# Patient Record
Sex: Female | Born: 1960
Health system: Southern US, Community
[De-identification: ages and names within clinical notes are randomized; demographics above are authoritative.]

## PROBLEM LIST (undated history)

## (undated) DIAGNOSIS — I10 Essential (primary) hypertension: Secondary | ICD-10-CM

## (undated) DIAGNOSIS — E7800 Pure hypercholesterolemia, unspecified: Secondary | ICD-10-CM

## (undated) HISTORY — PX: ABDOMINAL HYSTERECTOMY: SHX81

## (undated) HISTORY — PX: ROTATOR CUFF REPAIR: SHX139

## (undated) HISTORY — DX: Essential (primary) hypertension: I10

---

## 2000-11-20 ENCOUNTER — Ambulatory Visit (HOSPITAL_COMMUNITY): Admission: RE | Admit: 2000-11-20 | Discharge: 2000-11-20 | Payer: Self-pay | Admitting: Pulmonary Disease

## 2000-11-20 ENCOUNTER — Encounter: Payer: Self-pay | Admitting: Pulmonary Disease

## 2000-12-14 ENCOUNTER — Ambulatory Visit (HOSPITAL_COMMUNITY): Admission: RE | Admit: 2000-12-14 | Discharge: 2000-12-14 | Payer: Self-pay | Admitting: Urology

## 2008-01-30 ENCOUNTER — Ambulatory Visit (HOSPITAL_COMMUNITY): Admission: RE | Admit: 2008-01-30 | Discharge: 2008-01-30 | Payer: Self-pay | Admitting: Obstetrics & Gynecology

## 2008-02-04 ENCOUNTER — Ambulatory Visit (HOSPITAL_COMMUNITY): Admission: RE | Admit: 2008-02-04 | Discharge: 2008-02-04 | Payer: Self-pay | Admitting: Obstetrics & Gynecology

## 2008-06-18 ENCOUNTER — Emergency Department (HOSPITAL_COMMUNITY): Admission: EM | Admit: 2008-06-18 | Discharge: 2008-06-18 | Payer: Self-pay | Admitting: Emergency Medicine

## 2008-06-24 ENCOUNTER — Inpatient Hospital Stay (HOSPITAL_COMMUNITY): Admission: RE | Admit: 2008-06-24 | Discharge: 2008-06-28 | Payer: Self-pay | Admitting: Obstetrics & Gynecology

## 2008-06-24 ENCOUNTER — Encounter: Payer: Self-pay | Admitting: Obstetrics & Gynecology

## 2009-03-03 ENCOUNTER — Ambulatory Visit (HOSPITAL_COMMUNITY): Admission: RE | Admit: 2009-03-03 | Discharge: 2009-03-03 | Payer: Self-pay | Admitting: Pulmonary Disease

## 2010-03-29 ENCOUNTER — Other Ambulatory Visit (HOSPITAL_COMMUNITY): Payer: Self-pay | Admitting: Pulmonary Disease

## 2010-03-29 DIAGNOSIS — Z1231 Encounter for screening mammogram for malignant neoplasm of breast: Secondary | ICD-10-CM

## 2010-04-02 ENCOUNTER — Ambulatory Visit (HOSPITAL_COMMUNITY)
Admission: RE | Admit: 2010-04-02 | Discharge: 2010-04-02 | Disposition: A | Discharge status: Home / Self Care | Payer: Federal, State, Local not specified - PPO | Source: Ambulatory Visit | Attending: Pulmonary Disease | Admitting: Pulmonary Disease

## 2010-04-02 DIAGNOSIS — Z1231 Encounter for screening mammogram for malignant neoplasm of breast: Secondary | ICD-10-CM | POA: Insufficient documentation

## 2010-05-03 LAB — BASIC METABOLIC PANEL
BUN: 1 mg/dL — ABNORMAL LOW (ref 6–23)
Calcium: 7.7 mg/dL — ABNORMAL LOW (ref 8.4–10.5)
Creatinine, Ser: 0.67 mg/dL (ref 0.4–1.2)
GFR calc Af Amer: >60 mL/min (ref >60)
GFR calc non Af Amer: >60 mL/min (ref >60)

## 2010-05-03 LAB — CBC
HCT: 31.3 % — ABNORMAL LOW (ref 36.0–46.0)
HCT: 37.3 % (ref 36.0–46.0)
Hemoglobin: 13.4 g/dL (ref 12.0–15.0)
MCHC: 35.8 g/dL (ref 30.0–36.0)
MCHC: 36 g/dL (ref 30.0–36.0)
Platelets: 240 10*3/uL (ref 150–400)
RDW: 11.8 % (ref 11.5–15.5)
WBC: 5.7 10*3/uL (ref 4.0–10.5)

## 2010-05-03 LAB — ABO/RH: ABO/RH(D): A POS

## 2010-05-04 LAB — URINALYSIS, ROUTINE W REFLEX MICROSCOPIC
Bilirubin Urine: NEGATIVE
Glucose, UA: NEGATIVE mg/dL
Ketones, ur: NEGATIVE mg/dL
Leukocytes, UA: NEGATIVE
Nitrite: NEGATIVE
Protein, ur: NEGATIVE mg/dL
Specific Gravity, Urine: 1.021 (ref 1.005–1.030)
Urobilinogen, UA: 0.2 mg/dL (ref 0.0–1.0)
pH: 6 (ref 5.0–8.0)

## 2010-05-04 LAB — POCT PREGNANCY, URINE: Preg Test, Ur: NEGATIVE

## 2010-05-04 LAB — CBC
HCT: 40 % (ref 36.0–46.0)
MCV: 94 fL (ref 78.0–100.0)
Platelets: 231 10*3/uL (ref 150–400)
RDW: 12.3 % (ref 11.5–15.5)
WBC: 7.9 10*3/uL (ref 4.0–10.5)

## 2010-05-04 LAB — URINE MICROSCOPIC-ADD ON

## 2010-05-04 LAB — DIFFERENTIAL
Eosinophils Relative: 1 % (ref 0–5)
Lymphocytes Relative: 18 % (ref 12–46)
Lymphs Abs: 1.4 10*3/uL (ref 0.7–4.0)
Monocytes Absolute: 0.8 10*3/uL (ref 0.1–1.0)
Monocytes Relative: 10 % (ref 3–12)
Neutro Abs: 5.6 10*3/uL (ref 1.7–7.7)

## 2010-05-04 LAB — COMPREHENSIVE METABOLIC PANEL
AST: 18 U/L (ref 0–37)
Albumin: 3.6 g/dL (ref 3.5–5.2)
BUN: 14 mg/dL (ref 6–23)
Creatinine, Ser: 0.91 mg/dL (ref 0.4–1.2)
GFR calc Af Amer: >60 mL/min (ref >60)
Total Protein: 7.6 g/dL (ref 6.0–8.3)

## 2010-06-08 NOTE — Op Note (Signed)
Annette Patterson, Annette Patterson                  ACCOUNT NO.:  [id number]   MEDICAL RECORD NO.:  [id number]          PATIENT TYPE:  INP   LOCATION:  9319                          FACILITY:  [facility]   PHYSICIAN:  Roseanna Rainbow, M.D.DATE OF BIRTH:  1960/05/09   DATE OF PROCEDURE:  06/24/2008  DATE OF DISCHARGE:                               OPERATIVE REPORT   PREOPERATIVE DIAGNOSIS:  Symptomatic uterine fibroids with right-sided  hydronephrosis.   POSTOPERATIVE DIAGNOSIS:  Symptomatic uterine fibroids with right-sided  hydronephrosis.   PROCEDURE:  Total abdominal hysterectomy.   SURGEON:  Roseanna Rainbow, MD   ASSISTANT:  Bing Neighbors. Clearance Coots, MD   ANESTHESIA:  General endotracheal.   COMPLICATIONS:  None.   IV FLUIDS:  3 L crystalloid.   ESTIMATED BLOOD LOSS:  500 mL.   URINE OUTPUT:  300 mL.   FINDING:  The uterus was  diffusely enlarged, irregular in contour with  fibroid involvement.  There was an intraligamentous myoma on the right  side.  The ovaries were normal appearing.   PROCEDURE:  The patient was taken to the operating room with an IV  running.  She was placed in the dorsal lithotomy position and prepped  and draped in the usual sterile fashion.  After a time-out had been  completed, a midline vertical incision was made with scalpel.  The  incision was then extended down to the fascia with the Bovie.  The  fascia was incised along the length of the incision.  The rectus muscles  were separated.  The parietal peritoneum was picked up and entered  sharply.  This incision was then extended superiorly and inferiorly.  The bulk of uterus precluded placement of the retracting system.  At  this point, the bowel was packed away with moistened laparotomy sponges.  A towel clip was used to grasp the fundal myoma and this was used for  traction.  The Deaver was then placed into the abdomen on the right.  The round ligament was identified and divided using the Bovie.  The  peritoneum of the broad ligament was then incised to the midline.  The  bladder flap was then developed using sharp dissection using the Bovie.  As the ovaries were to be conserved, a window was made through the broad  ligament.  The utero-ovarian ligaments and fallopian tube was doubly  clamped with parametrial clamps.  The pedicle was secured with both a  free ligature of 0 Vicryl and a suture ligature.  Adequate hemostasis  was noted.  This procedure was then repeated on the left side.  To  facilitate securing the uterine vessel on the right, the  intraligamentous myoma that was noted above was enucleated using both  sharp and blunt dissection.  At this point, the uterine artery was  doubly clamped, transected, and secured with an 0 vicryl suture.  The  uterine artery on the left again was doubly clamped with parametrial  clamps, transected and suture ligated.  At this point, the uterine  fundus was amputated above the level of the uterine vessel pedicles.  Packing was  then removed from the abdomen.  A Balfour retractor was then  placed into the incision.  The bowel again was repacked with moistened  laparotomy packs.  Kocher clamps had been placed on the cervical stump.  The cardinal ligaments were then transected and suture ligated  bilaterally.  The cervix was then amputated.  The remainder of the  vaginal cuff was repaired with figure-of-eight 0 Vicryl sutures.  Adequate hemostasis was noted.  The  adnexal pedicles were then  inspected for hemostasis.  The peritoneal edge inferior to the tied  pedicle on the right was noted to have small active bleeding.  The  peritoneum was cauterized and small hemoclips were placed.  Adequate  hemostasis was noted.  The pelvis was then copiously irrigated.  The  retractor and sponge packs were then removed from the abdomen.  The  parietal peritoneum and fascial incisions were closed in a single layer  using 2 running sutures of 0 PDS.  The skin  was closed with staples.  At  the close of the procedure, the instrument and pack counts were said to  be correct x2.  The patient was taken to the PACU awake and in stable  condition.      Roseanna Rainbow, M.D.  Electronically Signed     LAJ/MEDQ  D:  06/24/2008  T:  06/25/2008  Job:  [id number]

## 2010-06-08 NOTE — Discharge Summary (Signed)
Annette Patterson, Annette Patterson                  ACCOUNT NO.:  [id number]   MEDICAL RECORD NO.:  [id number]          PATIENT TYPE:  INP   LOCATION:  9319                          [facility]  [facility]   PHYSICIAN:  Roseanna Rainbow, M.D.DATE OF BIRTH:  1960/08/11   DATE OF ADMISSION:  06/24/2008  DATE OF DISCHARGE:  06/28/2008                               DISCHARGE SUMMARY   CHIEF COMPLAINT:  The patient is a 50 year old with symptomatic uterine  fibroids, who presents for a total abdominal hysterectomy.  Please see  the dictated history and physical.   HOSPITAL COURSE:  The patient was admitted and underwent a total  abdominal hysterectomy.  Please see the dictated operative summary.  On  postoperative day #1, hemoglobin was 11.3 and basic metabolic profile  was normal.  Her bowel function gradually returned and she was  discharged to home.  On postoperative day #4, tolerating a regular diet.   DISCHARGE DIAGNOSIS:  Symptomatic uterine fibroids.   PROCEDURE:  Total abdominal hysterectomy.   CONDITION:  Good.   DIET:  Regular.   ACTIVITY:  Pelvic rest, progressive activity.   MEDICATIONS:  Percocet 1-2 tablets every 6 hours as needed.   DISPOSITION:  The patient was to follow up in the office in 1 week for  staple removal.      Roseanna Rainbow, M.D.  Electronically Signed     LAJ/MEDQ  D:  06/28/2008  T:  06/29/2008  Job:  [id number]

## 2010-06-08 NOTE — H&P (Signed)
NAMECHANTALE, Annette Patterson                  ACCOUNT NO.:  [id number]   MEDICAL RECORD NO.:  [id number]          PATIENT TYPE:  AMB   LOCATION:  SDC                           [facility]  [facility]   PHYSICIAN:  Roseanna Rainbow, M.D.DATE OF BIRTH:  Oct 17, 1960   DATE OF ADMISSION:  DATE OF DISCHARGE:                              HISTORY & PHYSICAL   CHIEF COMPLAINT:  The patient is a 50 year old with symptomatic uterine  fibroids who presents for a total abdominal hysterectomy.   HISTORY OF PRESENT ILLNESS:  The patient has a history of irregular  menses.  She has a history of a moderately enlarged myomatous uterus.  She recently presented to the emergency department with right-sided  flank pain.  A workup at this point included a CT scan of the abdomen  and pelvis that showed mild right-sided hydronephrosis likely secondary  to external compression by the myomatous uterus at the pelvic rim.  A  serum creatinine was normal.  Other workup had included a previous  pelvic ultrasound performed in January 2010 that showed a uterus  approximately 16 cm in sagittal diameter and a transverse diameter of  12.4 cm with diffuse fibroid involvement.  The fibroids measured up to  6.8 cm in diameter.  This was a central fibroid with a submucosal  component.  A hemoglobin in December 2009 was 13.4.  FSH, prolactin, TSH  were all normal.  A Pap smear in December 2009 was negative.  The  patient also underwent an endometrial biopsy in January 2010.  The  pathology failed to demonstrate any hyperplasia or malignancy.   ALLERGIES:  No known drug allergies.   MEDICATIONS:  Please see the medication reconciliation form.   PAST MEDICAL HISTORY:  No significant history of medical diseases.   PAST SURGICAL HISTORY:  She had a shoulder surgery and a vague history  of a possible vaginal procedure.   SOCIAL HISTORY:  She is married, living with her spouse.  She used to be  in [employer].  She has no significant  smoking history.  She drinks  a moderate amount of alcohol, drug beverages, and denies illicit drug  use.   FAMILY HISTORY:  Remarkable for asthma.   PAST GYN HISTORY:  Please see the above.   PAST OBSTETRICAL HISTORY:  The patient has never been pregnant.   REVIEW OF SYSTEMS:  GU:  Please see the above.   PHYSICAL EXAMINATION:  VITAL SIGNS:  Temperature 97.9, blood pressure  127/85, and weight 159 pounds.  GENERAL:  A well developed, well nourished [place] female, in  no apparent distress.  LUNGS:  Clear to auscultation bilaterally.  HEART:  Regular rate and rhythm.  NECK:  Supple.  ABDOMEN:  There is a mass arising from the pelvis which is approximately  4 fingerbreadths below the umbilicus.  There is minimal mobility.  The  mass is nontender.  PELVIC:  There are bilateral white patches on the vulva.  The vagina is  clean.  Cervix is without lesions.  The uterus is approximately 16 to 18  weeks aggregate size, irregular  contour.  The adnexa are nonpalpable.  BREASTS:  No masses or discharge, tenderness, or overriding skin  changes.   ASSESSMENT:  Symptomatic uterine fibroids.   PLAN:  The planned procedure is a total abdominal hysterectomy.  The  risks, benefits, and alternatives forms of management were reviewed with  the patient and informed consent has been obtained.  The patient would  like to conserve her ovaries if they are normal in appearance at the  time of surgery.      Roseanna Rainbow, M.D.  Electronically Signed     LAJ/MEDQ  D:  06/20/2008  T:  06/21/2008  Job:  [id number]

## 2010-06-11 NOTE — Op Note (Signed)
[facility]  Patient:    Annette Patterson, Annette Patterson Visit Number: [id number] MRN: [id number]          Service Type: DSU Location: DAY Attending Physician:  Lindaann Slough Dictated by:   Lindaann Slough, M.D. Proc. Date: 12/14/00 Admit Date:  12/14/2000   CC:         Eino Farber., M.D.   Operative Report  PREOPERATIVE DIAGNOSIS:  Microhematuria, rule out left ureteral tumor.  POSTOPERATIVE DIAGNOSIS:  Microhematuria, no ureteral tumor.  PROCEDURE PERFORMED:  Cystoscopy, left retrograde pyelogram, ureteroscopy, urethral balloon dilation of left intramural ureter, and insertion of double-J catheter.  SURGEON:  Lindaann Slough, M.D.  ANESTHESIA:  General.  INDICATIONS:  The patient is a 50 year old female who was found on urinalysis to have microhematuria.  An IVP showed normal kidneys and extrinsic compression of the left distal ureter without intramural lesion.  She also has proximal dilation of the distal ureter.  She is scheduled for cystoscopy, retrograde pyelogram, and ureteroscopy.  FINDINGS:  No bladder stones or tumor, and no ureteral stone or tumor.  DESCRIPTION OF PROCEDURE:  Under general anesthesia, the patient was prepped and draped, and placed in the dorsolithotomy position.  A #22 Wappler cystoscope was inserted in the bladder.  The bladder mucosa is normal.  There is no stone or tumor in the bladder.  The ureteral orifices are in normal position and shape with clear efflux.  A cone-tip catheter was then passed through the cystoscope into the left ureteral orifice.  Contrast was then injected through the cone-tip catheter.  The ureter appeared normal.  There is a mild extrinsic pressure on the left distal ureter without evidence of mucosal filling defect.  The cone-tip catheter was then removed.  A guidewire was then passed through the cystoscope into the left ureter.  The cystoscope was removed.  A 6.5-French rigid  ureteroscope could not be passed through the intramural ureter.  The ureter was _________ removed.  The intramural ureter was then dilated with the ureteral balloon catheter.  The ureteral balloon catheter was then removed.  The ureteroscope was then passed in the bladder and into the left ureter.  The ureter appears normal.  There is no stone or tumor in the ureter.  There is a coursing vessel at the level of the distal ureter that was pulsating, and that could explain the extrinsic defect of the distal ureter.  The ureteroscope was then advanced up to the upper ureter without difficulty, and there was no evidence of filling defect in the ureter. The ureteroscope was then removed.  The guidewire was then backloaded into the cystoscope and a #6-French - 26 double-J catheter was passed over the guidewire.  The proximal end of the double-J catheter is in the collecting system.  The distal end is in the bladder.  The bladder was then emptied and the cystoscope and guidewire were removed.  The patient tolerated the procedure well and left the OR in satisfactory condition to the postanesthesia care unit. Dictated by:   Lindaann Slough, M.D. Attending Physician:  Lindaann Slough DD:  12/14/00 TD:  12/15/00 Job: [id number]

## 2010-08-18 ENCOUNTER — Inpatient Hospital Stay (INDEPENDENT_AMBULATORY_CARE_PROVIDER_SITE_OTHER)
Admission: RE | Admit: 2010-08-18 | Discharge: 2010-08-18 | Disposition: A | Discharge status: Home / Self Care | Payer: Federal, State, Local not specified - PPO | Source: Ambulatory Visit | Attending: Emergency Medicine | Admitting: Emergency Medicine

## 2010-08-18 DIAGNOSIS — G609 Hereditary and idiopathic neuropathy, unspecified: Secondary | ICD-10-CM

## 2011-04-25 ENCOUNTER — Other Ambulatory Visit (HOSPITAL_COMMUNITY): Payer: Self-pay | Admitting: Family Medicine

## 2011-04-25 DIAGNOSIS — Z1231 Encounter for screening mammogram for malignant neoplasm of breast: Secondary | ICD-10-CM

## 2011-05-19 ENCOUNTER — Ambulatory Visit (HOSPITAL_COMMUNITY)
Admission: RE | Admit: 2011-05-19 | Discharge: 2011-05-19 | Disposition: A | Discharge status: Home / Self Care | Payer: Federal, State, Local not specified - PPO | Source: Ambulatory Visit | Attending: Family Medicine | Admitting: Family Medicine

## 2011-05-19 DIAGNOSIS — Z1231 Encounter for screening mammogram for malignant neoplasm of breast: Secondary | ICD-10-CM

## 2012-05-30 ENCOUNTER — Other Ambulatory Visit (HOSPITAL_COMMUNITY): Payer: Self-pay | Admitting: Pulmonary Disease

## 2012-05-30 DIAGNOSIS — Z1231 Encounter for screening mammogram for malignant neoplasm of breast: Secondary | ICD-10-CM

## 2012-06-08 ENCOUNTER — Ambulatory Visit (HOSPITAL_COMMUNITY)
Admission: RE | Admit: 2012-06-08 | Discharge: 2012-06-08 | Disposition: A | Discharge status: Home / Self Care | Payer: Federal, State, Local not specified - PPO | Source: Ambulatory Visit | Attending: Pulmonary Disease | Admitting: Pulmonary Disease

## 2012-06-08 DIAGNOSIS — Z1231 Encounter for screening mammogram for malignant neoplasm of breast: Secondary | ICD-10-CM

## 2012-12-17 ENCOUNTER — Other Ambulatory Visit (HOSPITAL_COMMUNITY): Payer: Self-pay | Admitting: Pulmonary Disease

## 2012-12-17 DIAGNOSIS — K219 Gastro-esophageal reflux disease without esophagitis: Secondary | ICD-10-CM

## 2012-12-24 ENCOUNTER — Other Ambulatory Visit (HOSPITAL_COMMUNITY): Payer: Federal, State, Local not specified - PPO

## 2012-12-24 ENCOUNTER — Ambulatory Visit (HOSPITAL_COMMUNITY): Payer: Federal, State, Local not specified - PPO

## 2013-01-30 ENCOUNTER — Encounter (HOSPITAL_COMMUNITY): Payer: Self-pay | Admitting: Emergency Medicine

## 2013-01-30 ENCOUNTER — Emergency Department (HOSPITAL_COMMUNITY)
Admission: EM | Admit: 2013-01-30 | Discharge: 2013-01-31 | Disposition: A | Discharge status: Home / Self Care | Payer: Federal, State, Local not specified - PPO | Attending: Emergency Medicine | Admitting: Emergency Medicine

## 2013-01-30 ENCOUNTER — Emergency Department (HOSPITAL_COMMUNITY): Payer: Federal, State, Local not specified - PPO

## 2013-01-30 DIAGNOSIS — Z9889 Other specified postprocedural states: Secondary | ICD-10-CM | POA: Insufficient documentation

## 2013-01-30 DIAGNOSIS — M25511 Pain in right shoulder: Secondary | ICD-10-CM

## 2013-01-30 DIAGNOSIS — Z862 Personal history of diseases of the blood and blood-forming organs and certain disorders involving the immune mechanism: Secondary | ICD-10-CM | POA: Insufficient documentation

## 2013-01-30 DIAGNOSIS — Z8639 Personal history of other endocrine, nutritional and metabolic disease: Secondary | ICD-10-CM | POA: Insufficient documentation

## 2013-01-30 DIAGNOSIS — M25519 Pain in unspecified shoulder: Secondary | ICD-10-CM | POA: Insufficient documentation

## 2013-01-30 HISTORY — DX: Pure hypercholesterolemia, unspecified: E78.00

## 2013-01-30 LAB — POCT I-STAT TROPONIN I: TROPONIN I, POC: 0.01 ng/mL (ref 0.00–0.08)

## 2013-01-30 MED ORDER — CYCLOBENZAPRINE HCL 10 MG PO TABS: 10.0000 mg | ORAL_TABLET | Freq: Three times a day (TID) | ORAL | Status: DC | PRN

## 2013-01-30 MED ORDER — HYDROCODONE-ACETAMINOPHEN 10-325 MG PO TABS: 0.5000 | ORAL_TABLET | Freq: Four times a day (QID) | ORAL | Status: DC | PRN

## 2013-01-30 NOTE — ED Notes (Signed)
Pt c/o right shoulder pain radiating to mid sternal area; worsening pain since this am

## 2013-01-30 NOTE — ED Notes (Signed)
Pt states pain increases when she reaches with her right arm

## 2013-01-30 NOTE — Discharge Instructions (Signed)
Do not drive, operate heavy machinery, drink alcohol, or take other tylenol containing products with these medicines Musculoskeletal Pain Musculoskeletal pain is muscle and boney aches and pains. These pains can occur in any part of the body. Your caregiver may treat you without knowing the cause of the pain. They may treat you if blood or urine tests, X-rays, and other tests were normal.  CAUSES There is often not a definite cause or reason for these pains. These pains may be caused by a type of germ (virus). The discomfort may also come from overuse. Overuse includes working out too hard when your body is not fit. Boney aches also come from weather changes. Bone is sensitive to atmospheric pressure changes. HOME CARE INSTRUCTIONS   Ask when your test results will be ready. Make sure you get your test results.  Only take over-the-counter or prescription medicines for pain, discomfort, or fever as directed by your caregiver. If you were given medications for your condition, do not drive, operate machinery or power tools, or sign legal documents for 24 hours. Do not drink alcohol. Do not take sleeping pills or other medications that may interfere with treatment.  Continue all activities unless the activities cause more pain. When the pain lessens, slowly resume normal activities. Gradually increase the intensity and duration of the activities or exercise.  During periods of severe pain, bed rest may be helpful. Lay or sit in any position that is comfortable.  Putting ice on the injured area.  Put ice in a bag.  Place a towel between your skin and the bag.  Leave the ice on for 15 to 20 minutes, 3 to 4 times a day.  Follow up with your caregiver for continued problems and no reason can be found for the pain. If the pain becomes worse or does not go away, it may be necessary to repeat tests or do additional testing. Your caregiver may need to look further for a possible cause. SEEK IMMEDIATE  MEDICAL CARE IF:  You have pain that is getting worse and is not relieved by medications.  You develop chest pain that is associated with shortness or breath, sweating, feeling sick to your stomach (nauseous), or throw up (vomit).  Your pain becomes localized to the abdomen.  You develop any new symptoms that seem different or that concern you. MAKE SURE YOU:   Understand these instructions.  Will watch your condition.  Will get help right away if you are not doing well or get worse. Document Released: 01/10/2005 Document Revised: 04/04/2011 Document Reviewed: 08/31/2007 [facility] Patient Information 2014 [place].  Heat Therapy Heat therapy can help ease achy, tense, stiff, and tight muscles and joints. Heat should not be used on new injuries. Wait at least 48 hours after the injury before using heat therapy. Heat also should not be used for discomfort or pain that occurs right after doing an activity. If you still have pain or stiffness 3 hours after finishing the activity, then heat therapy may be used. PRECAUTIONS  High heat or prolonged exposure to heat can cause burns. Be careful when using heat therapy to avoid burning your skin. If you have any of the following conditions, do not use heat until you have discussed heat therapy with your caregiver:  Poor circulation.  Healing wounds or scarred skin in the area being treated.  Diabetes, heart disease, or high blood pressure.  Numbness of the area being treated.  Unusual swelling of the area being treated.  Active  infections.  Blood clots.  Cancer.  Inability to communicate your response to pain. This can include young children and people with dementia. HOME CARE INSTRUCTIONS Moist heat pack  Soak a clean towel in warm water, and squeeze out the extra water. The water temperature should be comfortable to the skin.  Put the warm, wet towel in a plastic bag.  Place a thin, dry towel between your skin and the  bag.  Put the heat pack on the area for 5 minutes, and check your skin. Your skin may be pink, but it should not be red.  Leave the heat pack on the area for a total of 15 to 30 minutes.  Repeat this every 2 to 4 hours while awake. Do not use heat while you are sleeping. Warm water bath  Fill a tub with warm water. The water temperature should be comfortable to the skin.  Place the affected body part in the tub.  Soak the area for 20 to 40 minutes.  Repeat as needed. Hot water bottle  Fill the water bottle half full with hot water.  Press out the extra air. Close the cap tightly.  Place a dry towel between your skin and the bottle.  Put the bottle on the area for 5 minutes, and check your skin. Your skin may be pink, but it should not be red.  Leave the bottle on the area for a total of 15 to 30 minutes.  Repeat this every 2 to 4 hours while awake. Electric heating pad  Place a dry towel between your skin and the heating pad.  Set the heating pad on low heat.  Put the heating pad on the area for 10 minutes, and check your skin. Your skin may be pink, but it should not be red.  Leave the heating pad on the area for a total of 20 to 40 minutes.  Repeat this every 2 to 4 hours while awake.  Do not lie on the heating pad.  Do not fall asleep while using the heating pad.  Do not use the heating pad near water. Contact with water can result in an electrical shock. SEEK MEDICAL CARE IF:  You have blisters, redness, swelling, or numbness.  You have any new problems.  Your problems are getting worse.  You have any questions or concerns. If you develop any problems, stop using heat therapy until you see your caregiver. MAKE SURE YOU:  Understand these instructions.  Will watch your condition.  Will get help right away if you are not doing well or get worse. Document Released: 04/04/2011 Document Reviewed: 04/04/2011 [facility] Patient Information 2014 [place]. Marland Kitchen

## 2013-01-30 NOTE — ED Provider Notes (Signed)
CSN: [id number]     Arrival date & time 01/30/13  2202 History   First MD Initiated Contact with Patient 01/30/13 2223     Chief Complaint  Patient presents with  . Shoulder Pain   (Consider location/radiation/quality/duration/timing/severity/associated sxs/prior Treatment) HPI   This is a 53 year old female presents to the emergency department complaining of right neck and shoulder pain.  She has a history of right rotator cuff repair.  Patient is an employee at the [employer].  She states that she woke with pain in her left upper shoulder today.  Throughout the day as she was moving boxes and holding them of her head she had increasing pain.  Pain is worse when she turns her neck.  She denies any chest pain, shortness of breath, nausea, diaphoresis, left jaw or left shoulder pain.  Patient denies any respiratory symptoms.. patient tried BC powder without relief of her symptoms. Denies fevers, chills, myalgias, arthralgias. Denies DOE, SOB, chest tightness or pressure, radiation to left arm, jaw or back, or diaphoresis. Denies dysuria, flank pain, suprapubic pain, frequency, urgency, or hematuria. Denies headaches, light headedness, weakness, visual disturbances. Denies abdominal pain, nausea, vomiting, diarrhea or constipation.   Past Medical History  Diagnosis Date  . Hypercholesteremia    Past Surgical History  Procedure Laterality Date  . Rotator cuff repair    . Abdominal hysterectomy     No family history on file. History  Substance Use Topics  . Smoking status: Never Smoker   . Smokeless tobacco: Not on file  . Alcohol Use: 1.2 oz/week    2 Cans of beer per week   OB History   Grav Para Term Preterm Abortions TAB SAB Ect Mult Living                 Review of Systems Ten systems reviewed and are negative for acute change, except as noted in the HPI.   Allergies  Shellfish allergy  Home Medications  No current outpatient prescriptions on file. BP 131/79  Pulse 70   Temp(Src) 98.3 F (36.8 C)  Resp 16  SpO2 99% Physical Exam Physical Exam  Nursing note and vitals reviewed. Constitutional: She is oriented to person, place, and time. She appears well-developed and well-nourished. No distress.  HENT:  Head: Normocephalic and atraumatic.  Eyes: Conjunctivae normal and EOM are normal. Pupils are equal, round, and reactive to light. No scleral icterus.  Neck: Normal range of motion.  Cardiovascular: Normal rate, regular rhythm and normal heart sounds.  Exam reveals no gallop and no friction rub.   No murmur heard. Pulmonary/Chest: Effort normal and breath sounds normal. No respiratory distress.  Abdominal: Soft. Bowel sounds are normal. She exhibits no distension and no mass. There is no tenderness. There is no guarding.  Neurological: She is alert and oriented to person, place, and time.  Skin: Skin is warm and dry. She is not diaphoretic.  Musculoskeletal: Right shoulder with full range of motion.  Patient has tenderness to palpation at the insertion of the levator scapula on the right side.  Patient has pain with left neck rotation and and left lateral flexion of the neck.  ED Course  Procedures (including critical care time) Labs Review Labs Reviewed  POCT I-STAT TROPONIN I   Imaging Review Dg Shoulder Right  01/30/2013   CLINICAL DATA:  Pain  EXAM: RIGHT SHOULDER - 2+ VIEW  COMPARISON:  None.  FINDINGS: Frontal, Y scapular, and axillary images were obtained. There is moderate generalized osteoarthritic  change. No fracture or dislocation. No erosive change or intra-articular calcification.  IMPRESSION: Moderate osteoarthritis.  No fracture or dislocation.   Electronically Signed   By: Lowella Grip M.D.   On: 01/30/2013 22:56    EKG Interpretation    Date/Time:  Wednesday January 30 2013 22:19:27 EST Ventricular Rate:  84 PR Interval:  139 QRS Duration: 83 QT Interval:  366 QTC Calculation: 433 R Axis:   48 Text Interpretation:  Sinus  rhythm No significant change since last tracing Confirmed by KNAPP  MD-J, JON ([id number]) on 01/30/2013 10:43:49 PM            MDM   1. Trigger point of shoulder region, right   patient with musculoskeletal shoulder pain. Will d/c home with pain medication, muscle relaxer. F./u with pcp.     Margarita Mail, PA-C 01/31/13 [phone]

## 2013-01-31 NOTE — ED Provider Notes (Signed)
Medical screening examination/treatment/procedure(s) were performed by non-physician practitioner and as supervising physician I was immediately available for consultation/collaboration.  EKG Interpretation    Date/Time:  Wednesday January 30 2013 22:19:27 EST Ventricular Rate:  84 PR Interval:  139 QRS Duration: 83 QT Interval:  366 QTC Calculation: 433 R Axis:   48 Text Interpretation:  Sinus rhythm No significant change since last tracing Confirmed by KNAPP  MD-J, JON ([id number]) on 01/30/2013 10:43:49 PM             Veryl Speak, MD 01/31/13 [id number]

## 2013-04-17 ENCOUNTER — Ambulatory Visit (HOSPITAL_COMMUNITY)
Admission: RE | Admit: 2013-04-17 | Discharge: 2013-04-17 | Disposition: A | Discharge status: Home / Self Care | Payer: BC Managed Care – PPO | Source: Ambulatory Visit | Attending: Pulmonary Disease | Admitting: Pulmonary Disease

## 2013-04-17 ENCOUNTER — Other Ambulatory Visit (HOSPITAL_COMMUNITY): Payer: Self-pay | Admitting: Pulmonary Disease

## 2013-04-17 DIAGNOSIS — R52 Pain, unspecified: Secondary | ICD-10-CM

## 2013-04-17 DIAGNOSIS — M25559 Pain in unspecified hip: Secondary | ICD-10-CM | POA: Insufficient documentation

## 2013-04-17 DIAGNOSIS — M1990 Unspecified osteoarthritis, unspecified site: Secondary | ICD-10-CM

## 2013-04-17 DIAGNOSIS — R209 Unspecified disturbances of skin sensation: Secondary | ICD-10-CM | POA: Insufficient documentation

## 2013-06-26 ENCOUNTER — Other Ambulatory Visit (HOSPITAL_COMMUNITY): Payer: Self-pay | Admitting: Pulmonary Disease

## 2013-06-26 DIAGNOSIS — Z1231 Encounter for screening mammogram for malignant neoplasm of breast: Secondary | ICD-10-CM

## 2013-06-28 ENCOUNTER — Ambulatory Visit (HOSPITAL_COMMUNITY)
Admission: RE | Admit: 2013-06-28 | Discharge: 2013-06-28 | Disposition: A | Discharge status: Home / Self Care | Payer: Federal, State, Local not specified - PPO | Source: Ambulatory Visit | Attending: Pulmonary Disease | Admitting: Pulmonary Disease

## 2013-06-28 DIAGNOSIS — Z1231 Encounter for screening mammogram for malignant neoplasm of breast: Secondary | ICD-10-CM | POA: Insufficient documentation

## 2013-09-10 ENCOUNTER — Other Ambulatory Visit (HOSPITAL_COMMUNITY): Payer: Self-pay | Admitting: Pulmonary Disease

## 2013-09-10 DIAGNOSIS — R1310 Dysphagia, unspecified: Secondary | ICD-10-CM

## 2013-09-16 ENCOUNTER — Ambulatory Visit (HOSPITAL_COMMUNITY)
Admission: RE | Admit: 2013-09-16 | Discharge: 2013-09-16 | Disposition: A | Discharge status: Home / Self Care | Payer: BC Managed Care – PPO | Source: Ambulatory Visit | Attending: Pulmonary Disease | Admitting: Pulmonary Disease

## 2013-09-16 ENCOUNTER — Ambulatory Visit (HOSPITAL_COMMUNITY)
Admission: RE | Admit: 2013-09-16 | Discharge: 2013-09-16 | Disposition: A | Discharge status: Home / Self Care | Payer: Federal, State, Local not specified - PPO | Source: Ambulatory Visit | Attending: Pulmonary Disease | Admitting: Pulmonary Disease

## 2013-09-16 DIAGNOSIS — R1310 Dysphagia, unspecified: Secondary | ICD-10-CM

## 2013-09-16 DIAGNOSIS — K224 Dyskinesia of esophagus: Secondary | ICD-10-CM | POA: Insufficient documentation

## 2013-10-09 ENCOUNTER — Ambulatory Visit (INDEPENDENT_AMBULATORY_CARE_PROVIDER_SITE_OTHER): Payer: BC Managed Care – PPO | Admitting: Obstetrics & Gynecology

## 2013-10-09 ENCOUNTER — Encounter: Payer: Self-pay | Admitting: Obstetrics & Gynecology

## 2013-10-09 VITALS — BP 140/83 | HR 75 | Temp 98.0°F | Ht 63.0 in | Wt 167.0 lb

## 2013-10-09 DIAGNOSIS — Z01419 Encounter for gynecological examination (general) (routine) without abnormal findings: Secondary | ICD-10-CM

## 2013-10-09 DIAGNOSIS — N951 Menopausal and female climacteric states: Secondary | ICD-10-CM

## 2013-10-09 MED ORDER — ESTRADIOL 0.05 MG/24HR TD PTWK: 0.0500 mg | MEDICATED_PATCH | TRANSDERMAL | Status: DC

## 2013-10-09 NOTE — Progress Notes (Signed)
Subjective:     Annette Patterson is a 53 y.o. female here for a routine exam.  Current complaints: hot flushes .    Personal health questionnaire:  Is patient [name], have a family history of breast and/or ovarian cancer: no Is there a family history of uterine cancer diagnosed at age < 67, gastrointestinal cancer, urinary tract cancer, family member who is a Field seismologist syndrome-associated carrier: no Is the patient overweight and hypertensive, family history of diabetes, personal history of gestational diabetes or PCOS: no Is patient over 38, have PCOS,  family history of premature CHD under age 50, diabetes, smoke, have hypertension or peripheral artery disease:  no At any time, has a partner hit, kicked or otherwise hurt or frightened you?: no Over the past 2 weeks, have you felt down, depressed or hopeless?: no Over the past 2 weeks, have you felt little interest or pleasure in doing things?:no   Gynecologic History No LMP recorded. Patient has had a hysterectomy.      Last mammogram: 6/15. Results were: normal  Obstetric History OB History  Gravida Para Term Preterm AB SAB TAB Ectopic Multiple Living  0 0 0 0 0 0 0 0 0 0         Past Medical History  Diagnosis Date  . Hypercholesteremia     Past Surgical History  Procedure Laterality Date  . Rotator cuff repair    . Abdominal hysterectomy      Current outpatient prescriptions:atorvastatin (LIPITOR) 20 MG tablet, , Disp: , Rfl: ;  estradiol (CLIMARA - DOSED IN MG/24 HR) 0.05 mg/24hr patch, Place 1 patch (0.05 mg total) onto the skin once a week., Disp: 4 patch, Rfl: 12 Allergies  Allergen Reactions  . Shellfish Allergy Hives    History  Substance Use Topics  . Smoking status: Former Research scientist (life sciences)  . Smokeless tobacco: Former Systems developer    Quit date: 01/25/1988  . Alcohol Use: 1.2 oz/week    2 Cans of beer per week    Family History  Problem Relation Age of Onset  . Hypertension Mother   . Asthma Mother   . Hypertension  Father       Review of Systems  Constitutional: negative for fatigue and weight loss Respiratory: negative for cough and wheezing Cardiovascular: negative for chest pain, fatigue and palpitations Gastrointestinal: negative for abdominal pain and change in bowel habits Musculoskeletal:negative for myalgias Neurological: negative for gait problems and tremors Behavioral/Psych: negative for abusive relationship, depression Endocrine: negative for temperature intolerance   Genitourinary:negative for genital lesions; positive for  hot flashes Integument/breast: negative for breast lump, breast tenderness, nipple discharge and skin lesion(s)    Objective:       BP 140/83  Pulse 75  Temp(Src) 98 F (36.7 C)  Ht 5\' 3"  (1.6 m)  Wt 75.751 kg (167 lb)  BMI 29.59 kg/m2 General:   alert  Skin:   no rash or abnormalities  Lungs:   clear to auscultation bilaterally  Heart:   regular rate and rhythm, S1, S2 normal, no murmur, click, rub or gallop  Breasts:   normal without suspicious masses, skin or nipple changes or axillary nodes  Abdomen:  normal findings: no organomegaly, soft, non-tender and no hernia  Pelvis:  External genitalia: normal general appearance Urinary system: urethral meatus normal and bladder without fullness, nontender Vaginal: normal without tenderness, induration or masses Adnexa: normal bimanual exam    Lab Review  Labs reviewed no Radiologic studies reviewed no    Assessment:  Healthy female exam.  Menopausal symptoms   Plan:    Education reviewed: calcium supplements, low fat, low cholesterol diet, weight bearing exercise and lifestyle modifications for menopausal symptoms. Hormone replacement therapy: risks and benefits reviewed and follow up appointment recommended.   Meds ordered this encounter  Medications  . atorvastatin (LIPITOR) 20 MG tablet    Sig:   . estradiol (CLIMARA - DOSED IN MG/24 HR) 0.05 mg/24hr patch    Sig: Place 1 patch  (0.05 mg total) onto the skin once a week.    Dispense:  4 patch    Refill:  12

## 2013-10-10 ENCOUNTER — Encounter: Payer: Self-pay | Admitting: Obstetrics & Gynecology

## 2013-10-10 NOTE — Patient Instructions (Signed)
Hormone Therapy At menopause, your body begins making less estrogen and progesterone hormones. This causes the body to stop having menstrual periods. This is because estrogen and progesterone hormones control your periods and menstrual cycle. A lack of estrogen may cause symptoms such as:  Hot flushes (or hot flashes).  Vaginal dryness.  Dry skin.  Loss of sex drive.  Risk of bone loss (osteoporosis). When this happens, you may choose to take hormone therapy to get back the estrogen lost during menopause. When the hormone estrogen is given alone, it is usually referred to as ET (Estrogen Therapy). When the hormone progestin is combined with estrogen, it is generally called HT (Hormone Therapy). This was formerly known as hormone replacement therapy (HRT). Your caregiver can help you make a decision on what will be best for you. The decision to use HT seems to change often as new studies are done. Many studies do not agree on the benefits of hormone replacement therapy. LIKELY BENEFITS OF HT INCLUDE PROTECTION FROM:  Hot Flushes (also called hot flashes) - A hot flush is a sudden feeling of heat that spreads over the face and body. The skin may redden like a blush. It is connected with sweats and sleep disturbance. Women going through menopause may have hot flushes a few times a month or several times per day depending on the woman.  Osteoporosis (bone loss)- Estrogen helps guard against bone loss. After menopause, a woman's bones slowly lose calcium and become weak and brittle. As a result, bones are more likely to break. The hip, wrist, and spine are affected most often. Hormone therapy can help slow bone loss after menopause. Weight bearing exercise and taking calcium with vitamin D also can help prevent bone loss. There are also medications that your caregiver can prescribe that can help prevent osteoporosis.  Vaginal Dryness - Loss of estrogen causes changes in the vagina. Its lining may  become thin and dry. These changes can cause pain and bleeding during sexual intercourse. Dryness can also lead to infections. This can cause burning and itching. (Vaginal estrogen treatment can help relieve pain, itching, and dryness.)  Urinary Tract Infections are more common after menopause because of lack of estrogen. Some women also develop urinary incontinence because of low estrogen levels in the vagina and bladder.  Possible other benefits of estrogen include a positive effect on mood and short-term memory in women. RISKS AND COMPLICATIONS  Using estrogen alone without progesterone causes the lining of the uterus to grow. This increases the risk of lining of the uterus (endometrial) cancer. Your caregiver should give another hormone called progestin if you have a uterus.  Women who take combined (estrogen and progestin) HT appear to have an increased risk of breast cancer. The risk appears to be small, but increases throughout the time that HT is taken.  Combined therapy also makes the breast tissue slightly denser which makes it harder to read mammograms (breast X-rays).  Combined, estrogen and progesterone therapy can be taken together every day, in which case there may be spotting of blood. HT therapy can be taken cyclically in which case you will have menstrual periods. Cyclically means HT is taken for a set amount of days, then not taken, then this process is repeated.  HT may increase the risk of stroke, heart attack, breast cancer and forming blood clots in your leg.  Transdermal estrogen (estrogen that is absorbed through the skin with a patch or a cream) may have more positive results with:    Cholesterol.  Blood pressure.  Blood clots. Having the following conditions may indicate you should not have HT:  Endometrial cancer.  Liver disease.  Breast cancer.  Heart disease.  History of blood clots.  Stroke. TREATMENT   If you choose to take HT and have a uterus,  usually estrogen and progestin are prescribed.  Your caregiver will help you decide the best way to take the medications.  Possible ways to take estrogen include:  Pills.  Patches.  Gels.  Sprays.  Vaginal estrogen cream, rings and tablets.  It is best to take the lowest dose possible that will help your symptoms and take them for the shortest period of time that you can.  Hormone therapy can help relieve some of the problems (symptoms) that affect women at menopause. Before making a decision about HT, talk to your caregiver about what is best for you. Be well informed and comfortable with your decisions. HOME CARE INSTRUCTIONS   Follow your caregivers advice when taking the medications.  A Pap test is done to screen for cervical cancer.  The first Pap test should be done at age 21.  Between ages 21 and 29, Pap tests are repeated every 2 years.  Beginning at age 30, you are advised to have a Pap test every 3 years as long as your past 3 Pap tests have been normal.  Some women have medical problems that increase the chance of getting cervical cancer. Talk to your caregiver about these problems. It is especially important to talk to your caregiver if a new problem develops soon after your last Pap test. In these cases, your caregiver may recommend more frequent screening and Pap tests.  The above recommendations are the same for women who have or have not gotten the vaccine for HPV (Human Papillomavirus).  If you had a hysterectomy for a problem that was not a cancer or a condition that could lead to cancer, then you no longer need Pap tests. However, even if you no longer need a Pap test, a regular exam is a good idea to make sure no other problems are starting.   If you are between ages 65 and 70, and you have had normal Pap tests going back 10 years, you no longer need Pap tests. However, even if you no longer need a Pap test, a regular exam is a good idea to make sure no  other problems are starting.   If you have had past treatment for cervical cancer or a condition that could lead to cancer, you need Pap tests and screening for cancer for at least 20 years after your treatment.  If Pap tests have been discontinued, risk factors (such as a new sexual partner) need to be re-assessed to determine if screening should be resumed.  Some women may need screenings more often if they are at high risk for cervical cancer.  Get mammograms done as per the advice of your caregiver. SEEK IMMEDIATE MEDICAL CARE IF:  You develop abnormal vaginal bleeding.  You have pain or swelling in your legs, shortness of breath, or chest pain.  You develop dizziness or headaches.  You have lumps or changes in your breasts or armpits.  You have slurred speech.  You develop weakness or numbness of your arms or legs.  You have pain, burning, or bleeding when urinating.  You develop abdominal pain. Document Released: 10/09/2002 Document Revised: 04/04/2011 Document Reviewed: 01/27/2010 ExitCare Patient Information 2015 [employer]. This information is not intended to   replace advice given to you by your health care provider. Make sure you discuss any questions you have with your health care provider. Menopause Menopause is the normal time of life when menstrual periods stop completely. Menopause is complete when you have missed 12 consecutive menstrual periods. It usually occurs between the ages of 48 years and 55 years. Very rarely does a woman develop menopause before the age of 40 years. At menopause, your ovaries stop producing the female hormones estrogen and progesterone. This can cause undesirable symptoms and also affect your health. Sometimes the symptoms may occur 4-5 years before the menopause begins. There is no relationship between menopause and:  Oral contraceptives.  Number of children you had.  Race.  The age your menstrual periods started  (menarche). Heavy smokers and very thin women may develop menopause earlier in life. CAUSES  The ovaries stop producing the female hormones estrogen and progesterone.  Other causes include:  Surgery to remove both ovaries.  The ovaries stop functioning for no known reason.  Tumors of the pituitary gland in the brain.  Medical disease that affects the ovaries and hormone production.  Radiation treatment to the abdomen or pelvis.  Chemotherapy that affects the ovaries. SYMPTOMS   Hot flashes.  Night sweats.  Decrease in sex drive.  Vaginal dryness and thinning of the vagina causing painful intercourse.  Dryness of the skin and developing wrinkles.  Headaches.  Tiredness.  Irritability.  Memory problems.  Weight gain.  Bladder infections.  Hair growth of the face and chest.  Infertility. More serious symptoms include:  Loss of bone (osteoporosis) causing breaks (fractures).  Depression.  Hardening and narrowing of the arteries (atherosclerosis) causing heart attacks and strokes. DIAGNOSIS   When the menstrual periods have stopped for 12 straight months.  Physical exam.  Hormone studies of the blood. TREATMENT  There are many treatment choices and nearly as many questions about them. The decisions to treat or not to treat menopausal changes is an individual choice made with your health care provider. Your health care provider can discuss the treatments with you. Together, you can decide which treatment will work best for you. Your treatment choices may include:   Hormone therapy (estrogen and progesterone).  Non-hormonal medicines.  Treating the individual symptoms with medicine (for example antidepressants for depression).  Herbal medicines that may help specific symptoms.  Counseling by a psychiatrist or psychologist.  Group therapy.  Lifestyle changes including:  Eating healthy.  Regular exercise.  Limiting caffeine and  alcohol.  Stress management and meditation.  No treatment. HOME CARE INSTRUCTIONS   Take the medicine your health care provider gives you as directed.  Get plenty of sleep and rest.  Exercise regularly.  Eat a diet that contains calcium (good for the bones) and soy products (acts like estrogen hormone).  Avoid alcoholic beverages.  Do not smoke.  If you have hot flashes, dress in layers.  Take supplements, calcium, and vitamin D to strengthen bones.  You can use over-the-counter lubricants or moisturizers for vaginal dryness.  Group therapy is sometimes very helpful.  Acupuncture may be helpful in some cases. SEEK MEDICAL CARE IF:   You are not sure you are in menopause.  You are having menopausal symptoms and need advice and treatment.  You are still having menstrual periods after age 55 years.  You have pain with intercourse.  Menopause is complete (no menstrual period for 12 months) and you develop vaginal bleeding.  You need a referral to a specialist (  gynecologist, psychiatrist, or psychologist) for treatment. SEEK IMMEDIATE MEDICAL CARE IF:   You have severe depression.  You have excessive vaginal bleeding.  You fell and think you have a broken bone.  You have pain when you urinate.  You develop leg or chest pain.  You have a fast pounding heart beat (palpitations).  You have severe headaches.  You develop vision problems.  You feel a lump in your breast.  You have abdominal pain or severe indigestion. Document Released: 04/02/2003 Document Revised: 09/12/2012 Document Reviewed: 08/09/2012 ExitCare Patient Information 2015 [employer]. This information is not intended to replace advice given to you by your health care provider. Make sure you discuss any questions you have with your health care provider.  

## 2014-01-08 ENCOUNTER — Ambulatory Visit (INDEPENDENT_AMBULATORY_CARE_PROVIDER_SITE_OTHER): Payer: BC Managed Care – PPO | Admitting: Obstetrics & Gynecology

## 2014-01-08 ENCOUNTER — Encounter: Payer: Self-pay | Admitting: Obstetrics & Gynecology

## 2014-01-08 VITALS — BP 117/85 | HR 69 | Temp 97.9°F | Ht 62.0 in | Wt 169.0 lb

## 2014-01-08 DIAGNOSIS — N951 Menopausal and female climacteric states: Secondary | ICD-10-CM

## 2014-01-08 NOTE — Progress Notes (Signed)
Patient ID: [name], female   DOB: 1960/01/30, 53 y.o.   MRN: [id number]  Chief Complaint  Patient presents with  . Follow-up    Hot flashes, Patch     HPI Annette Patterson is a 53 y.o. female.  C/O mild irritation at patch site.  Less hot flushes.  HPI  Past Medical History  Diagnosis Date  . Hypercholesteremia     Past Surgical History  Procedure Laterality Date  . Rotator cuff repair    . Abdominal hysterectomy      Family History  Problem Relation Age of Onset  . Hypertension Mother   . Asthma Mother   . Hypertension Father     Social History History  Substance Use Topics  . Smoking status: Former Research scientist (life sciences)  . Smokeless tobacco: Former Systems developer    Quit date: 01/25/1988  . Alcohol Use: 1.2 oz/week    2 Cans of beer per week    Allergies  Allergen Reactions  . Shellfish Allergy Hives    Current Outpatient Prescriptions  Medication Sig Dispense Refill  . atorvastatin (LIPITOR) 20 MG tablet     . estradiol (CLIMARA - DOSED IN MG/24 HR) 0.05 mg/24hr patch Place 1 patch (0.05 mg total) onto the skin once a week. 4 patch 12   No current facility-administered medications for this visit.    Review of Systems Review of Systems Constitutional: negative for fatigue and weight loss Respiratory: negative for cough and wheezing Cardiovascular: negative for chest pain, fatigue and palpitations Gastrointestinal: negative for abdominal pain and change in bowel habits Genitourinary:negative Integument/breast: negative for nipple discharge Musculoskeletal:negative for myalgias Neurological: negative for gait problems and tremors Behavioral/Psych: negative for abusive relationship, depression Endocrine: negative for temperature intolerance     Blood pressure 117/85, pulse 69, temperature 97.9 F (36.6 C), height 5\' 2"  (1.575 m), weight 76.658 kg (169 lb).  Physical Exam Physical Exam   50% of 15 min visit spent on counseling and coordination of care.   Data  Reviewed None  Assessment    ERT w/improvement in symptoms     Plan     Possible management options include:change formulation-->cream or gel Follow up as needed or 1 yr        JACKSON-MOORE,Amirr Achord A 01/08/2014, 11:45 AM

## 2014-01-20 ENCOUNTER — Encounter: Payer: Self-pay | Admitting: *Deleted

## 2014-01-21 ENCOUNTER — Encounter: Payer: Self-pay | Admitting: Obstetrics & Gynecology

## 2014-02-13 ENCOUNTER — Telehealth: Payer: Self-pay | Admitting: *Deleted

## 2014-02-13 NOTE — Telephone Encounter (Signed)
1135:  Pt called to office regarding medication that Dr Delsa Sale had prescribed for hot flashes.  Pt states that she has been using a patch.  Pt states that she has stopped using the patch due to skin irritation, red/raised area on skin from patch.  1:10 Return call to pt.  Pt states same concerns regarding patch and skin irritation and discomfort.  Pt made aware that sometimes there may be a reaction to adhesives from the patch.  Pt states that she is not happy with the mark it is leaving on her skin.  Pt states that she has stopped using patch as of Tuesday this week.  Pt made aware that we would follow up with physician in order to determine alternative medication/treatment.  Pt made aware that once reviewed by physician she could expect return call.   Pt states understanding.   Please advise on medication for pt hot flashes and medication.

## 2014-02-14 NOTE — Telephone Encounter (Signed)
No other option for patch except she may try generic for Climara patch.  OK to Rx generic if she wants to try.

## 2014-02-19 ENCOUNTER — Other Ambulatory Visit: Payer: Self-pay | Admitting: *Deleted

## 2014-02-19 NOTE — Progress Notes (Signed)
error 

## 2014-02-28 NOTE — Telephone Encounter (Signed)
LM on VM to contact office

## 2014-03-03 NOTE — Telephone Encounter (Signed)
Pt returned call. In return call to pt.  Pt made aware that there may be other options in treatmetn, pills/creams/gels.  Pt made aware that she may want to contact her insurance carrier to determine coverage on ERT.  Pt advised to contact office once she has spoken with ins and make office aware of coverage. Pt states understanding.

## 2014-03-04 ENCOUNTER — Other Ambulatory Visit: Payer: Self-pay | Admitting: *Deleted

## 2014-03-04 MED ORDER — VIVELLE-DOT 0.05 MG/24HR TD PTTW: 1.0000 | MEDICATED_PATCH | TRANSDERMAL | Status: DC

## 2014-03-04 NOTE — Progress Notes (Signed)
Change in therapy for ERT.  Called pt, LM that states Rx has been sent to her pharmacy.  Pt made aware that there are discount cards at office if she would like to come by to pick up.  Pt advised to contact office if any problems.

## 2014-03-04 NOTE — Telephone Encounter (Signed)
Change in Rx has been sent to [facility].  LM for pt making her aware that Rx has been sent and to contact office if any problems.

## 2014-06-18 ENCOUNTER — Emergency Department (HOSPITAL_COMMUNITY)
Admission: EM | Admit: 2014-06-18 | Discharge: 2014-06-18 | Disposition: A | Discharge status: Home / Self Care | Payer: Federal, State, Local not specified - PPO | Source: Home / Self Care | Attending: Family Medicine | Admitting: Family Medicine

## 2014-06-18 ENCOUNTER — Encounter (HOSPITAL_COMMUNITY): Payer: Self-pay | Admitting: Emergency Medicine

## 2014-06-18 DIAGNOSIS — S29012A Strain of muscle and tendon of back wall of thorax, initial encounter: Secondary | ICD-10-CM | POA: Diagnosis not present

## 2014-06-18 MED ORDER — CYCLOBENZAPRINE HCL 10 MG PO TABS: 10.0000 mg | ORAL_TABLET | Freq: Every evening | ORAL | Status: DC | PRN

## 2014-06-18 MED ORDER — NAPROXEN 500 MG PO TABS: 500.0000 mg | ORAL_TABLET | Freq: Two times a day (BID) | ORAL | Status: DC

## 2014-06-18 NOTE — ED Notes (Signed)
C/o upper back pain States she was getting ready for work this evening when the pain struck Denies any injury States has done therapy on back for leg pain for 4-5 months  States back hurts when she coughs No tx tried

## 2014-06-18 NOTE — Discharge Instructions (Signed)
Thank you for coming in today. Come back or go to the emergency room if you notice new weakness new numbness problems walking or bowel or bladder problems.   Back Exercises These exercises may help you when beginning to rehabilitate your injury. Your symptoms may resolve with or without further involvement from your physician, physical therapist or athletic trainer. While completing these exercises, remember:   Restoring tissue flexibility helps normal motion to return to the joints. This allows healthier, less painful movement and activity.  An effective stretch should be held for at least 30 seconds.  A stretch should never be painful. You should only feel a gentle lengthening or release in the stretched tissue. STRETCH - Extension, Prone on Elbows   Lie on your stomach on the floor, a bed will be too soft. Place your palms about shoulder width apart and at the height of your head.  Place your elbows under your shoulders. If this is too painful, stack pillows under your chest.  Allow your body to relax so that your hips drop lower and make contact more completely with the floor.  Hold this position for __________ seconds.  Slowly return to lying flat on the floor. Repeat __________ times. Complete this exercise __________ times per day.  RANGE OF MOTION - Extension, Prone Press Ups   Lie on your stomach on the floor, a bed will be too soft. Place your palms about shoulder width apart and at the height of your head.  Keeping your back as relaxed as possible, slowly straighten your elbows while keeping your hips on the floor. You may adjust the placement of your hands to maximize your comfort. As you gain motion, your hands will come more underneath your shoulders.  Hold this position __________ seconds.  Slowly return to lying flat on the floor. Repeat __________ times. Complete this exercise __________ times per day.  RANGE OF MOTION- Quadruped, Neutral Spine   Assume a hands  and knees position on a firm surface. Keep your hands under your shoulders and your knees under your hips. You may place padding under your knees for comfort.  Drop your head and point your tail bone toward the ground below you. This will round out your low back like an angry cat. Hold this position for __________ seconds.  Slowly lift your head and release your tail bone so that your back sags into a large arch, like an old horse.  Hold this position for __________ seconds.  Repeat this until you feel limber in your low back.  Now, find your "sweet spot." This will be the most comfortable position somewhere between the two previous positions. This is your neutral spine. Once you have found this position, tense your stomach muscles to support your low back.  Hold this position for __________ seconds. Repeat __________ times. Complete this exercise __________ times per day.  STRETCH - Flexion, Single Knee to Chest   Lie on a firm bed or floor with both legs extended in front of you.  Keeping one leg in contact with the floor, bring your opposite knee to your chest. Hold your leg in place by either grabbing behind your thigh or at your knee.  Pull until you feel a gentle stretch in your low back. Hold __________ seconds.  Slowly release your grasp and repeat the exercise with the opposite side. Repeat __________ times. Complete this exercise __________ times per day.  STRETCH - Hamstrings, Standing  Stand or sit and extend your right / left  leg, placing your foot on a chair or foot stool  Keeping a slight arch in your low back and your hips straight forward.  Lead with your chest and lean forward at the waist until you feel a gentle stretch in the back of your right / left knee or thigh. (When done correctly, this exercise requires leaning only a small distance.)  Hold this position for __________ seconds. Repeat __________ times. Complete this stretch __________ times per  day. STRENGTHENING - Deep Abdominals, Pelvic Tilt   Lie on a firm bed or floor. Keeping your legs in front of you, bend your knees so they are both pointed toward the ceiling and your feet are flat on the floor.  Tense your lower abdominal muscles to press your low back into the floor. This motion will rotate your pelvis so that your tail bone is scooping upwards rather than pointing at your feet or into the floor.  With a gentle tension and even breathing, hold this position for __________ seconds. Repeat __________ times. Complete this exercise __________ times per day.  STRENGTHENING - Abdominals, Crunches   Lie on a firm bed or floor. Keeping your legs in front of you, bend your knees so they are both pointed toward the ceiling and your feet are flat on the floor. Cross your arms over your chest.  Slightly tip your chin down without bending your neck.  Tense your abdominals and slowly lift your trunk high enough to just clear your shoulder blades. Lifting higher can put excessive stress on the low back and does not further strengthen your abdominal muscles.  Control your return to the starting position. Repeat __________ times. Complete this exercise __________ times per day.  STRENGTHENING - Quadruped, Opposite UE/LE Lift   Assume a hands and knees position on a firm surface. Keep your hands under your shoulders and your knees under your hips. You may place padding under your knees for comfort.  Find your neutral spine and gently tense your abdominal muscles so that you can maintain this position. Your shoulders and hips should form a rectangle that is parallel with the floor and is not twisted.  Keeping your trunk steady, lift your right hand no higher than your shoulder and then your left leg no higher than your hip. Make sure you are not holding your breath. Hold this position __________ seconds.  Continuing to keep your abdominal muscles tense and your back steady, slowly return  to your starting position. Repeat with the opposite arm and leg. Repeat __________ times. Complete this exercise __________ times per day. Document Released: 01/28/2005 Document Revised: 04/04/2011 Document Reviewed: 04/24/2008 [facility] Patient Information 2015 [place], [place]. This information is not intended to replace advice given to you by your health care provider. Make sure you discuss any questions you have with your health care provider.

## 2014-06-18 NOTE — ED Provider Notes (Signed)
Annette Patterson is a 54 y.o. female who presents to Urgent Care today for left upper back pain. Patient has one day of upper left back pain. Pain occurred without injury. Pain does not radiate. No weakness or numbness bowel bladder dysfunction. Pain is moderate to severe. She feels well otherwise. No treatment tried yet.   Past Medical History  Diagnosis Date  . Hypercholesteremia    Past Surgical History  Procedure Laterality Date  . Rotator cuff repair    . Abdominal hysterectomy     History  Substance Use Topics  . Smoking status: Former Research scientist (life sciences)  . Smokeless tobacco: Former Systems developer    Quit date: 01/25/1988  . Alcohol Use: 1.2 oz/week    2 Cans of beer per week   ROS as above Medications: No current facility-administered medications for this encounter.   Current Outpatient Prescriptions  Medication Sig Dispense Refill  . atorvastatin (LIPITOR) 20 MG tablet     . cyclobenzaprine (FLEXERIL) 10 MG tablet Take 1 tablet (10 mg total) by mouth at bedtime as needed for muscle spasms. 20 tablet 0  . naproxen (NAPROSYN) 500 MG tablet Take 1 tablet (500 mg total) by mouth 2 (two) times daily. 30 tablet 0  . VIVELLE-DOT 0.05 MG/24HR patch Place 1 patch (0.05 mg total) onto the skin 2 (two) times a week. 8 patch 12   Allergies  Allergen Reactions  . Shellfish Allergy Hives     Exam:  BP 133/90 mmHg  Pulse 71  Temp(Src) 98.1 F (36.7 C) (Oral)  Resp 16  SpO2 99% Gen: Well NAD HEENT: EOMI,  MMM Lungs: Normal work of breathing. CTABL Heart: RRR no MRG Abd: NABS, Soft. Nondistended, Nontender Exts: Brisk capillary refill, warm and well perfused.  Back: Nontender to midline. Tender palpation left rhomboid. Pain with scapular motion. Upper extremity strength is equal and normal bilaterally. Neck is nontender with normal neck range of motion.  No results found for this or any previous visit (from the past 24 hour(s)). No results found.  Assessment and Plan: 54 y.o. female with  rhomboid strain. Treat with naproxen and Flexeril. Physical therapy. Follow-up with sports medicine.  Discussed warning signs or symptoms. Please see discharge instructions. Patient expresses understanding.     Gregor Hams, MD 06/18/14 1700

## 2014-06-25 ENCOUNTER — Other Ambulatory Visit (HOSPITAL_COMMUNITY): Payer: Self-pay | Admitting: Pulmonary Disease

## 2014-06-25 DIAGNOSIS — Z1231 Encounter for screening mammogram for malignant neoplasm of breast: Secondary | ICD-10-CM

## 2014-07-09 ENCOUNTER — Ambulatory Visit (HOSPITAL_COMMUNITY)
Admission: RE | Admit: 2014-07-09 | Discharge: 2014-07-09 | Disposition: A | Discharge status: Home / Self Care | Payer: Federal, State, Local not specified - PPO | Source: Ambulatory Visit | Attending: Pulmonary Disease | Admitting: Pulmonary Disease

## 2014-07-09 DIAGNOSIS — Z1231 Encounter for screening mammogram for malignant neoplasm of breast: Secondary | ICD-10-CM | POA: Insufficient documentation

## 2014-10-15 ENCOUNTER — Ambulatory Visit: Payer: Self-pay | Admitting: Certified Nurse Midwife

## 2014-10-15 ENCOUNTER — Ambulatory Visit: Payer: Federal, State, Local not specified - PPO | Admitting: Certified Nurse Midwife

## 2014-10-30 ENCOUNTER — Ambulatory Visit (INDEPENDENT_AMBULATORY_CARE_PROVIDER_SITE_OTHER): Payer: Federal, State, Local not specified - PPO | Admitting: Certified Nurse Midwife

## 2014-10-30 ENCOUNTER — Telehealth: Payer: Self-pay

## 2014-10-30 ENCOUNTER — Ambulatory Visit: Payer: Federal, State, Local not specified - PPO | Admitting: Certified Nurse Midwife

## 2014-10-30 ENCOUNTER — Encounter: Payer: Self-pay | Admitting: Certified Nurse Midwife

## 2014-10-30 VITALS — BP 118/82 | HR 66 | Ht 63.0 in | Wt 168.0 lb

## 2014-10-30 DIAGNOSIS — Z01419 Encounter for gynecological examination (general) (routine) without abnormal findings: Secondary | ICD-10-CM

## 2014-10-30 DIAGNOSIS — Z1389 Encounter for screening for other disorder: Secondary | ICD-10-CM

## 2014-10-30 DIAGNOSIS — N959 Unspecified menopausal and perimenopausal disorder: Secondary | ICD-10-CM

## 2014-10-30 MED ORDER — ESTRADIOL 1 MG PO TABS: 1.0000 mg | ORAL_TABLET | Freq: Every day | ORAL | Status: DC

## 2014-10-30 NOTE — Telephone Encounter (Signed)
left vm that patient has appt with [facility] with Arlyss Gandy on 11/07/14 arrive by 9:15am - gave her their address and phone number

## 2014-10-30 NOTE — Progress Notes (Signed)
Patient ID: Annette Patterson, female   DOB: 02-Nov-1960, 54 y.o.   MRN: [id number]   Subjective:      Annette Patterson is a 54 y.o. female here for a routine exam.  Current complaints: Hot flashes, stopped d/t skin irritation. Irregularly sexually active. Does have vaginal irritation occasionally.        Personal health questionnaire:  Is patient [name], have a family history of breast and/or ovarian cancer: yes,  Is there a family history of uterine cancer diagnosed at age < 89, gastrointestinal cancer, urinary tract cancer, family member who is a Field seismologist syndrome-associated carrier: no Is the patient overweight and hypertensive, family history of diabetes, personal history of gestational diabetes, preeclampsia or PCOS: yes Is patient over 68, have PCOS,  family history of premature CHD under age 15, diabetes, smoke, have hypertension or peripheral artery disease:  yes At any time, has a partner hit, kicked or otherwise hurt or frightened you?: no Over the past 2 weeks, have you felt down, depressed or hopeless?: no Over the past 2 weeks, have you felt little interest or pleasure in doing things?:no   Gynecologic History No LMP recorded. Patient has had a hysterectomy. Contraception: status post hysterectomy Last Pap: unknown. Results were: normal according to the patient Last mammogram: June 2016. Results were: normal  Obstetric History OB History  Gravida Para Term Preterm AB SAB TAB Ectopic Multiple Living  0 0 0 0 0 0 0 0 0 0         Past Medical History  Diagnosis Date  . Hypercholesteremia     Past Surgical History  Procedure Laterality Date  . Rotator cuff repair    . Abdominal hysterectomy       Current outpatient prescriptions:  .  atorvastatin (LIPITOR) 20 MG tablet, , Disp: , Rfl:  .  naproxen (NAPROSYN) 500 MG tablet, Take 1 tablet (500 mg total) by mouth 2 (two) times daily., Disp: 30 tablet, Rfl: 0 .  cyclobenzaprine (FLEXERIL) 10 MG tablet, Take 1 tablet (10 mg  total) by mouth at bedtime as needed for muscle spasms. (Patient not taking: Reported on 10/30/2014), Disp: 20 tablet, Rfl: 0 .  estradiol (ESTRACE) 1 MG tablet, Take 1 tablet (1 mg total) by mouth daily., Disp: 30 tablet, Rfl: 12 Allergies  Allergen Reactions  . Shellfish Allergy Hives    Social History  Substance Use Topics  . Smoking status: Former Research scientist (life sciences)  . Smokeless tobacco: Former Systems developer    Quit date: 01/25/1988  . Alcohol Use: 1.2 oz/week    2 Cans of beer per week    Family History  Problem Relation Age of Onset  . Hypertension Mother   . Asthma Mother   . Hypertension Father       Review of Systems  Constitutional: negative for fatigue and weight loss Respiratory: negative for cough and wheezing Cardiovascular: negative for chest pain, fatigue and palpitations Gastrointestinal: negative for abdominal pain and change in bowel habits Musculoskeletal:negative for myalgias Neurological: negative for gait problems and tremors Behavioral/Psych: negative for abusive relationship, depression Endocrine: negative for temperature intolerance, + hot flashes   Genitourinary:negative for abnormal menstrual periods, genital lesions, hot flashes, sexual problems and vaginal discharge, + vaginal irration occasionally Integument/breast: negative for breast lump, breast tenderness, nipple discharge and skin lesion(s)    Objective:       BP 118/82 mmHg  Pulse 66  Ht 5\' 3"  (1.6 m)  Wt 168 lb (76.204 kg)  BMI 29.77 kg/m2 General:  alert  Skin:   no rash or abnormalities  Lungs:   clear to auscultation bilaterally  Heart:   regular rate and rhythm, S1, S2 normal, no murmur, click, rub or gallop  Breasts:   normal without suspicious masses, skin or nipple changes or axillary nodes  Abdomen:  normal findings: no organomegaly, soft, non-tender and no hernia  Pelvis:  External genitalia: normal general appearance Urinary system: urethral meatus normal and bladder without fullness,  nontender Vaginal: normal without tenderness, induration or masses, absent rugae, slight erythema noted Cervix: surgically absent Adnexa: normal bimanual exam Uterus: surgically absent   Lab Review Urine pregnancy test Labs reviewed yes Radiologic studies reviewed yes  50% of 30 min visit spent on counseling and coordination of care.   Assessment:    Healthy female exam.   Post menopausal symptoms   Plan:    Education reviewed: calcium supplements, depression evaluation, low fat, low cholesterol diet, safe sex/STD prevention, self breast exams, skin cancer screening and weight bearing exercise. Contraception: status post hysterectomy. Hormone replacement therapy: hormone replacement therapy: Estrace, risks and benefits reviewed and prescription for 12 months. Follow up in: 1 year. Mammogram up to date.    No orders of the defined types were placed in this encounter.   No orders of the defined types were placed in this encounter.   Need to obtain previous records Possible management options include: estring, premarin, estrace cream, osphena.

## 2014-10-31 ENCOUNTER — Other Ambulatory Visit: Payer: Self-pay | Admitting: Certified Nurse Midwife

## 2014-10-31 DIAGNOSIS — N760 Acute vaginitis: Principal | ICD-10-CM

## 2014-10-31 DIAGNOSIS — B9689 Other specified bacterial agents as the cause of diseases classified elsewhere: Secondary | ICD-10-CM

## 2014-10-31 LAB — PAP IG (IMAGE GUIDED)

## 2014-10-31 MED ORDER — METRONIDAZOLE 500 MG PO TABS: 500.0000 mg | ORAL_TABLET | Freq: Two times a day (BID) | ORAL | Status: DC

## 2014-11-04 LAB — SURESWAB BACTERIAL VAGINOSIS/ITIS
Atopobium vaginae: 7.3 Log (cells/mL)
C. PARAPSILOSIS, DNA: NOT DETECTED
C. TROPICALIS, DNA: NOT DETECTED
C. albicans, DNA: NOT DETECTED
C. glabrata, DNA: NOT DETECTED
Gardnerella vaginalis: >8 Log (cells/mL)
LACTOBACILLUS SPECIES: NOT DETECTED Log (cells/mL)
MEGASPHAERA SPECIES: >8 Log (cells/mL)
T. vaginalis RNA, QL TMA: NOT DETECTED

## 2014-11-10 ENCOUNTER — Other Ambulatory Visit: Payer: Self-pay | Admitting: Certified Nurse Midwife

## 2015-07-14 ENCOUNTER — Other Ambulatory Visit: Payer: Self-pay | Admitting: Pulmonary Disease

## 2015-07-14 DIAGNOSIS — Z1231 Encounter for screening mammogram for malignant neoplasm of breast: Secondary | ICD-10-CM

## 2015-07-27 ENCOUNTER — Ambulatory Visit
Admission: RE | Admit: 2015-07-27 | Discharge: 2015-07-27 | Disposition: A | Discharge status: Home / Self Care | Payer: Federal, State, Local not specified - PPO | Source: Ambulatory Visit | Attending: Pulmonary Disease | Admitting: Pulmonary Disease

## 2015-07-27 DIAGNOSIS — Z1231 Encounter for screening mammogram for malignant neoplasm of breast: Secondary | ICD-10-CM | POA: Diagnosis not present

## 2015-08-17 DIAGNOSIS — M1990 Unspecified osteoarthritis, unspecified site: Secondary | ICD-10-CM | POA: Diagnosis not present

## 2015-08-17 DIAGNOSIS — Z79899 Other long term (current) drug therapy: Secondary | ICD-10-CM | POA: Diagnosis not present

## 2015-08-17 DIAGNOSIS — M791 Myalgia: Secondary | ICD-10-CM | POA: Diagnosis not present

## 2015-08-17 DIAGNOSIS — E785 Hyperlipidemia, unspecified: Secondary | ICD-10-CM | POA: Diagnosis not present

## 2015-08-17 DIAGNOSIS — E7800 Pure hypercholesterolemia, unspecified: Secondary | ICD-10-CM | POA: Diagnosis not present

## 2015-08-17 DIAGNOSIS — M5136 Other intervertebral disc degeneration, lumbar region: Secondary | ICD-10-CM | POA: Diagnosis not present

## 2015-09-01 DIAGNOSIS — K080 Exfoliation of teeth due to systemic causes: Secondary | ICD-10-CM | POA: Diagnosis not present

## 2015-11-03 ENCOUNTER — Telehealth: Payer: Self-pay

## 2015-11-03 ENCOUNTER — Encounter: Payer: Self-pay | Admitting: Obstetrics & Gynecology

## 2015-11-03 ENCOUNTER — Ambulatory Visit (INDEPENDENT_AMBULATORY_CARE_PROVIDER_SITE_OTHER): Payer: Federal, State, Local not specified - PPO | Admitting: Obstetrics & Gynecology

## 2015-11-03 VITALS — BP 131/84 | HR 67 | Temp 98.2°F | Ht 63.0 in | Wt 169.2 lb

## 2015-11-03 DIAGNOSIS — D259 Leiomyoma of uterus, unspecified: Secondary | ICD-10-CM

## 2015-11-03 DIAGNOSIS — Z01419 Encounter for gynecological examination (general) (routine) without abnormal findings: Secondary | ICD-10-CM

## 2015-11-03 NOTE — Progress Notes (Signed)
Patient is in office for annual.

## 2015-11-03 NOTE — Progress Notes (Signed)
Patient ID: Annette Patterson, female   DOB: [birth year], 55 y.o.   MRN: [id number]  Chief Complaint  Patient presents with  . Gynecologic Exam    Patient is in the office for annual exam.    HPI Annette Patterson is a 55 y.o. female.  [id number] S/p TAH 2010 for fibroid uterus. Last mammogram was 3 mo ago, normal.  HPI  Past Medical History:  Diagnosis Date  . Hypercholesteremia     Past Surgical History:  Procedure Laterality Date  . ABDOMINAL HYSTERECTOMY    . ROTATOR CUFF REPAIR      Family History  Problem Relation Age of Onset  . Hypertension Mother   . Asthma Mother   . Hypertension Father     Social History Social History  Substance Use Topics  . Smoking status: Former Research scientist (life sciences)  . Smokeless tobacco: Former Systems developer    Quit date: 01/25/1988  . Alcohol use 1.2 oz/week    2 Cans of beer per week    Allergies  Allergen Reactions  . Shellfish Allergy Hives    Current Outpatient Prescriptions  Medication Sig Dispense Refill  . atorvastatin (LIPITOR) 20 MG tablet     . cyclobenzaprine (FLEXERIL) 10 MG tablet Take 1 tablet (10 mg total) by mouth at bedtime as needed for muscle spasms. (Patient not taking: Reported on 11/03/2015) 20 tablet 0  . estradiol (ESTRACE) 1 MG tablet Take 1 tablet (1 mg total) by mouth daily. (Patient not taking: Reported on 11/03/2015) 30 tablet 12  . metroNIDAZOLE (FLAGYL) 500 MG tablet Take 1 tablet (500 mg total) by mouth 2 (two) times daily. (Patient not taking: Reported on 11/03/2015) 14 tablet 0  . naproxen (NAPROSYN) 500 MG tablet Take 1 tablet (500 mg total) by mouth 2 (two) times daily. (Patient not taking: Reported on 11/03/2015) 30 tablet 0   No current facility-administered medications for this visit.     Review of Systems Review of Systems  Constitutional: Negative.   Cardiovascular: Negative.   Gastrointestinal: Negative.   Genitourinary: Negative.   Skin: Negative for rash.       Mild itch    Blood pressure 131/84, pulse 67,  temperature 98.2 F (36.8 C), temperature source Oral, height 5\' 3"  (1.6 m), weight 169 lb 3.2 oz (76.7 kg).  Physical Exam Physical Exam  Constitutional: She is oriented to person, place, and time. She appears well-developed. No distress.  HENT:  Head: Normocephalic.  Neck: Normal range of motion.  Cardiovascular: Normal rate.   Pulmonary/Chest: Effort normal. No respiratory distress.  Abdominal: She exhibits no distension. There is no tenderness.  Genitourinary:  Genitourinary Comments: Deferred, not indicated  Neurological: She is alert and oriented to person, place, and time.  Skin: Skin is warm and dry. No rash noted.  Psychiatric: She has a normal mood and affect. Her behavior is normal.    Data Reviewed Pap 2016 and surg path 2010  Assessment    Well woman exam, s/p TAH 2010 benign fibroid    Plan    Annual exam with pelvic only as indicated by symptoms Yearly breast exam and mammogram Recommend screening colonoscopy if not already done       Deborahann Poteat 11/03/2015, 10:42 AM

## 2015-11-03 NOTE — Patient Instructions (Signed)
Breast Self-Awareness Practicing breast self-awareness may pick up problems early, prevent significant medical complications, and possibly save your life. By practicing breast self-awareness, you can become familiar with how your breasts look and feel and if your breasts are changing. This allows you to notice changes early. It can also offer you some reassurance that your breast health is good. One way to learn what is normal for your breasts and whether your breasts are changing is to do a breast self-exam. If you find a lump or something that was not present in the past, it is best to contact your caregiver right away. Other findings that should be evaluated by your caregiver include nipple discharge, especially if it is bloody; skin changes or reddening; areas where the skin seems to be pulled in (retracted); or new lumps and bumps. Breast pain is seldom associated with cancer (malignancy), but should also be evaluated by a caregiver. HOW TO PERFORM A BREAST SELF-EXAM The best time to examine your breasts is 5-7 days after your menstrual period is over. During menstruation, the breasts are lumpier, and it may be more difficult to pick up changes. If you do not menstruate, have reached menopause, or had your uterus removed (hysterectomy), you should examine your breasts at regular intervals, such as monthly. If you are breastfeeding, examine your breasts after a feeding or after using a breast pump. Breast implants do not decrease the risk for lumps or tumors, so continue to perform breast self-exams as recommended. Talk to your caregiver about how to determine the difference between the implant and breast tissue. Also, talk about the amount of pressure you should use during the exam. Over time, you will become more familiar with the variations of your breasts and more comfortable with the exam. A breast self-exam requires you to remove all your clothes above the waist. 1. Look at your breasts and nipples.  Stand in front of a mirror in a room with good lighting. With your hands on your hips, push your hands firmly downward. Look for a difference in shape, contour, and size from one breast to the other (asymmetry). Asymmetry includes puckers, dips, or bumps. Also, look for skin changes, such as reddened or scaly areas on the breasts. Look for nipple changes, such as discharge, dimpling, repositioning, or redness. 2. Carefully feel your breasts. This is best done either in the shower or tub while using soapy water or when flat on your back. Place the arm (on the side of the breast you are examining) above your head. Use the pads (not the fingertips) of your three middle fingers on your opposite hand to feel your breasts. Start in the underarm area and use  inch (2 cm) overlapping circles to feel your breast. Use 3 different levels of pressure (light, medium, and firm pressure) at each circle before moving to the next circle. The light pressure is needed to feel the tissue closest to the skin. The medium pressure will help to feel breast tissue a little deeper, while the firm pressure is needed to feel the tissue close to the ribs. Continue the overlapping circles, moving downward over the breast until you feel your ribs below your breast. Then, move one finger-width towards the center of the body. Continue to use the  inch (2 cm) overlapping circles to feel your breast as you move slowly up toward the collar bone (clavicle) near the base of the neck. Continue the up and down exam using all 3 pressures until you reach the   middle of the chest. Do this with each breast, carefully feeling for lumps or changes. 3.  Keep a written record with breast changes or normal findings for each breast. By writing this information down, you do not need to depend only on memory for size, tenderness, or location. Write down where you are in your menstrual cycle, if you are still menstruating. Breast tissue can have some lumps or  thick tissue. However, see your caregiver if you find anything that concerns you.  SEEK MEDICAL CARE IF:  You see a change in shape, contour, or size of your breasts or nipples.   You see skin changes, such as reddened or scaly areas on the breasts or nipples.   You have an unusual discharge from your nipples.   You feel a new lump or unusually thick areas.    This information is not intended to replace advice given to you by your health care provider. Make sure you discuss any questions you have with your health care provider.   Document Released: 01/10/2005 Document Revised: 12/28/2011 Document Reviewed: 04/27/2011 Elsevier Interactive Patient Education 2016 [employer]  

## 2016-01-28 DIAGNOSIS — M791 Myalgia: Secondary | ICD-10-CM | POA: Diagnosis not present

## 2016-01-28 DIAGNOSIS — M1990 Unspecified osteoarthritis, unspecified site: Secondary | ICD-10-CM | POA: Diagnosis not present

## 2016-01-28 DIAGNOSIS — M5136 Other intervertebral disc degeneration, lumbar region: Secondary | ICD-10-CM | POA: Diagnosis not present

## 2016-01-28 DIAGNOSIS — E7800 Pure hypercholesterolemia, unspecified: Secondary | ICD-10-CM | POA: Diagnosis not present

## 2016-05-09 DIAGNOSIS — K080 Exfoliation of teeth due to systemic causes: Secondary | ICD-10-CM | POA: Diagnosis not present

## 2016-07-22 ENCOUNTER — Other Ambulatory Visit: Payer: Self-pay | Admitting: Pulmonary Disease

## 2016-07-22 DIAGNOSIS — Z1231 Encounter for screening mammogram for malignant neoplasm of breast: Secondary | ICD-10-CM

## 2016-07-28 DIAGNOSIS — Z8249 Family history of ischemic heart disease and other diseases of the circulatory system: Secondary | ICD-10-CM | POA: Diagnosis not present

## 2016-07-28 DIAGNOSIS — R3129 Other microscopic hematuria: Secondary | ICD-10-CM | POA: Diagnosis not present

## 2016-07-28 DIAGNOSIS — Z79899 Other long term (current) drug therapy: Secondary | ICD-10-CM | POA: Diagnosis not present

## 2016-07-28 DIAGNOSIS — E7800 Pure hypercholesterolemia, unspecified: Secondary | ICD-10-CM | POA: Diagnosis not present

## 2016-07-28 DIAGNOSIS — M159 Polyosteoarthritis, unspecified: Secondary | ICD-10-CM | POA: Diagnosis not present

## 2016-07-28 DIAGNOSIS — M609 Myositis, unspecified: Secondary | ICD-10-CM | POA: Diagnosis not present

## 2016-07-28 DIAGNOSIS — M542 Cervicalgia: Secondary | ICD-10-CM | POA: Diagnosis not present

## 2016-08-04 ENCOUNTER — Ambulatory Visit: Payer: Federal, State, Local not specified - PPO

## 2016-08-16 ENCOUNTER — Ambulatory Visit
Admission: RE | Admit: 2016-08-16 | Discharge: 2016-08-16 | Disposition: A | Discharge status: Home / Self Care | Payer: Federal, State, Local not specified - PPO | Source: Ambulatory Visit | Attending: Pulmonary Disease | Admitting: Pulmonary Disease

## 2016-08-16 DIAGNOSIS — Z1231 Encounter for screening mammogram for malignant neoplasm of breast: Secondary | ICD-10-CM

## 2016-09-16 ENCOUNTER — Other Ambulatory Visit (HOSPITAL_COMMUNITY): Payer: Self-pay | Admitting: Pulmonary Disease

## 2016-09-16 ENCOUNTER — Ambulatory Visit (HOSPITAL_COMMUNITY)
Admission: RE | Admit: 2016-09-16 | Discharge: 2016-09-16 | Disposition: A | Discharge status: Home / Self Care | Payer: Federal, State, Local not specified - PPO | Source: Ambulatory Visit | Attending: Pulmonary Disease | Admitting: Pulmonary Disease

## 2016-09-16 DIAGNOSIS — R200 Anesthesia of skin: Secondary | ICD-10-CM | POA: Insufficient documentation

## 2016-09-16 DIAGNOSIS — M542 Cervicalgia: Secondary | ICD-10-CM | POA: Diagnosis not present

## 2016-09-16 DIAGNOSIS — M5030 Other cervical disc degeneration, unspecified cervical region: Secondary | ICD-10-CM | POA: Diagnosis not present

## 2016-09-16 DIAGNOSIS — M47812 Spondylosis without myelopathy or radiculopathy, cervical region: Secondary | ICD-10-CM | POA: Diagnosis not present

## 2016-09-22 DIAGNOSIS — R002 Palpitations: Secondary | ICD-10-CM | POA: Diagnosis not present

## 2016-09-22 DIAGNOSIS — E7800 Pure hypercholesterolemia, unspecified: Secondary | ICD-10-CM | POA: Diagnosis not present

## 2016-09-22 DIAGNOSIS — M542 Cervicalgia: Secondary | ICD-10-CM | POA: Diagnosis not present

## 2016-09-22 DIAGNOSIS — M159 Polyosteoarthritis, unspecified: Secondary | ICD-10-CM | POA: Diagnosis not present

## 2016-10-04 ENCOUNTER — Telehealth: Payer: Self-pay

## 2016-10-04 NOTE — Telephone Encounter (Signed)
Sent notes to scheduling 

## 2016-10-19 DIAGNOSIS — R002 Palpitations: Secondary | ICD-10-CM | POA: Insufficient documentation

## 2016-10-26 ENCOUNTER — Ambulatory Visit (INDEPENDENT_AMBULATORY_CARE_PROVIDER_SITE_OTHER): Payer: Federal, State, Local not specified - PPO

## 2016-10-26 DIAGNOSIS — R002 Palpitations: Secondary | ICD-10-CM | POA: Diagnosis not present

## 2016-11-07 DIAGNOSIS — M609 Myositis, unspecified: Secondary | ICD-10-CM | POA: Diagnosis not present

## 2016-11-07 DIAGNOSIS — M542 Cervicalgia: Secondary | ICD-10-CM | POA: Diagnosis not present

## 2016-11-07 DIAGNOSIS — M159 Polyosteoarthritis, unspecified: Secondary | ICD-10-CM | POA: Diagnosis not present

## 2016-11-07 DIAGNOSIS — R3129 Other microscopic hematuria: Secondary | ICD-10-CM | POA: Diagnosis not present

## 2016-11-07 DIAGNOSIS — E7800 Pure hypercholesterolemia, unspecified: Secondary | ICD-10-CM | POA: Diagnosis not present

## 2016-11-07 DIAGNOSIS — Z79899 Other long term (current) drug therapy: Secondary | ICD-10-CM | POA: Diagnosis not present

## 2016-12-26 DIAGNOSIS — K080 Exfoliation of teeth due to systemic causes: Secondary | ICD-10-CM | POA: Diagnosis not present

## 2017-02-07 ENCOUNTER — Other Ambulatory Visit (HOSPITAL_COMMUNITY): Payer: Self-pay | Admitting: Pulmonary Disease

## 2017-02-07 DIAGNOSIS — M2550 Pain in unspecified joint: Secondary | ICD-10-CM | POA: Diagnosis not present

## 2017-02-07 DIAGNOSIS — E7800 Pure hypercholesterolemia, unspecified: Secondary | ICD-10-CM | POA: Diagnosis not present

## 2017-02-07 DIAGNOSIS — M792 Neuralgia and neuritis, unspecified: Secondary | ICD-10-CM | POA: Diagnosis not present

## 2017-02-07 DIAGNOSIS — Z8249 Family history of ischemic heart disease and other diseases of the circulatory system: Secondary | ICD-10-CM | POA: Diagnosis not present

## 2017-02-07 DIAGNOSIS — Z79899 Other long term (current) drug therapy: Secondary | ICD-10-CM | POA: Diagnosis not present

## 2017-02-07 DIAGNOSIS — M791 Myalgia, unspecified site: Secondary | ICD-10-CM | POA: Diagnosis not present

## 2017-02-07 DIAGNOSIS — M899 Disorder of bone, unspecified: Secondary | ICD-10-CM | POA: Diagnosis not present

## 2017-02-17 ENCOUNTER — Ambulatory Visit (HOSPITAL_COMMUNITY)
Admission: RE | Admit: 2017-02-17 | Discharge: 2017-02-17 | Disposition: A | Discharge status: Home / Self Care | Payer: Federal, State, Local not specified - PPO | Source: Ambulatory Visit | Attending: Pulmonary Disease | Admitting: Pulmonary Disease

## 2017-02-17 DIAGNOSIS — M792 Neuralgia and neuritis, unspecified: Secondary | ICD-10-CM

## 2017-02-17 DIAGNOSIS — M50223 Other cervical disc displacement at C6-C7 level: Secondary | ICD-10-CM | POA: Diagnosis not present

## 2017-02-17 DIAGNOSIS — G588 Other specified mononeuropathies: Secondary | ICD-10-CM | POA: Diagnosis not present

## 2017-02-21 ENCOUNTER — Encounter: Payer: Self-pay | Admitting: Certified Nurse Midwife

## 2017-02-21 ENCOUNTER — Ambulatory Visit (INDEPENDENT_AMBULATORY_CARE_PROVIDER_SITE_OTHER): Payer: Federal, State, Local not specified - PPO | Admitting: Certified Nurse Midwife

## 2017-02-21 VITALS — BP 122/83 | HR 67 | Wt 162.8 lb

## 2017-02-21 DIAGNOSIS — Z01419 Encounter for gynecological examination (general) (routine) without abnormal findings: Secondary | ICD-10-CM | POA: Diagnosis not present

## 2017-02-21 DIAGNOSIS — E8941 Symptomatic postprocedural ovarian failure: Secondary | ICD-10-CM

## 2017-02-21 MED ORDER — OSPEMIFENE 60 MG PO TABS: 1.0000 | ORAL_TABLET | Freq: Every day | ORAL | 4 refills | Status: DC

## 2017-02-21 MED ORDER — ESTRADIOL 1 MG PO TABS: 1.0000 mg | ORAL_TABLET | Freq: Every day | ORAL | 12 refills | Status: DC

## 2017-02-21 NOTE — Progress Notes (Signed)
Pt is in the office for annual, hx of hysterectomy. Pt declines std testing

## 2017-02-21 NOTE — Progress Notes (Signed)
Subjective:        Annette Patterson is a 57 y.o. female here for a routine exam.  Current complaints: hot flashes, married, irregular sexual activity.  Currently employed full time at [employer].  Hx of total hysterectomy (has ovaries) in 2010.  Here for annual exam.  Does not require a pap smear: hysterectomy was for benign reasons: fibroids.     Personal health questionnaire:  Is patient Annette Patterson, have a family history of breast and/or ovarian cancer: no Is there a family history of uterine cancer diagnosed at age < 14, gastrointestinal cancer, urinary tract cancer, family member who is a Field seismologist syndrome-associated carrier: no Is the patient overweight and hypertensive, family history of diabetes, personal history of gestational diabetes, preeclampsia or PCOS: no Is patient over 7, have PCOS,  family history of premature CHD under age 61, diabetes, smoke, have hypertension or peripheral artery disease:  yes At any time, has a partner hit, kicked or otherwise hurt or frightened you?: no Over the past 2 weeks, have you felt down, depressed or hopeless?: no Over the past 2 weeks, have you felt little interest or pleasure in doing things?:no   Gynecologic History No LMP recorded. Patient has had a hysterectomy. Contraception: status post hysterectomy Last Pap: 10/30/14. Results were: normal Last mammogram: 08/16/16. Results were: normal  Obstetric History OB History  Gravida Para Term Preterm AB Living  0 0 0 0 0 0  SAB TAB Ectopic Multiple Live Births  0 0 0 0          Past Medical History:  Diagnosis Date  . Hypercholesteremia     Past Surgical History:  Procedure Laterality Date  . ABDOMINAL HYSTERECTOMY    . ROTATOR CUFF REPAIR       Current Outpatient Medications:  .  atorvastatin (LIPITOR) 20 MG tablet, , Disp: , Rfl:  .  cyclobenzaprine (FLEXERIL) 10 MG tablet, Take 1 tablet (10 mg total) by mouth at bedtime as needed for muscle spasms. (Patient not  taking: Reported on 11/03/2015), Disp: 20 tablet, Rfl: 0 .  estradiol (ESTRACE) 1 MG tablet, Take 1 tablet (1 mg total) by mouth daily., Disp: 30 tablet, Rfl: 12 .  metroNIDAZOLE (FLAGYL) 500 MG tablet, Take 1 tablet (500 mg total) by mouth 2 (two) times daily. (Patient not taking: Reported on 11/03/2015), Disp: 14 tablet, Rfl: 0 .  naproxen (NAPROSYN) 500 MG tablet, Take 1 tablet (500 mg total) by mouth 2 (two) times daily. (Patient not taking: Reported on 11/03/2015), Disp: 30 tablet, Rfl: 0 .  Ospemifene (OSPHENA) 60 MG TABS, Take 1 tablet by mouth daily., Disp: 90 tablet, Rfl: 4 Allergies  Allergen Reactions  . Shellfish Allergy Hives    Social History   Tobacco Use  . Smoking status: Former Research scientist (life sciences)  . Smokeless tobacco: Former Systems developer    Quit date: 01/25/1988  Substance Use Topics  . Alcohol use: Yes    Alcohol/week: 1.2 oz    Types: 2 Cans of beer per week    Family History  Problem Relation Age of Onset  . Hypertension Mother   . Asthma Mother   . Hypertension Father   . Breast cancer Maternal Aunt   . Breast cancer Cousin   . Breast cancer Maternal Aunt   . Breast cancer Cousin       Review of Systems  Constitutional: negative for fatigue and weight loss Respiratory: negative for cough and wheezing Cardiovascular: negative for chest pain, fatigue and palpitations Gastrointestinal:  negative for abdominal pain and change in bowel habits Musculoskeletal:negative for myalgias Neurological: negative for gait problems and tremors Behavioral/Psych: negative for abusive relationship, depression Endocrine: negative for temperature intolerance    Genitourinary:negative for abnormal menstrual periods, genital lesions, hot flashes, sexual problems and vaginal discharge Integument/breast: negative for breast lump, breast tenderness, nipple discharge and skin lesion(s)    Objective:       BP 122/83   Pulse 67   Wt 162 lb 12.8 oz (73.8 kg)   BMI 28.84 kg/m  General:   alert   Skin:   no rash or abnormalities  Lungs:   clear to auscultation bilaterally  Heart:   regular rate and rhythm, S1, S2 normal, no murmur, click, rub or gallop  Breasts:   normal without suspicious masses, skin or nipple changes or axillary nodes  Abdomen:  normal findings: no organomegaly, soft, non-tender and no hernia  Pelvis:  External genitalia: normal general appearance Urinary system: urethral meatus normal and bladder without fullness, nontender Vaginal: normal without tenderness, induration or masses Cervix: normal appearance Adnexa: normal bimanual exam Uterus: anteverted and non-tender, normal size   Lab Review Urine pregnancy test Labs reviewed yes Radiologic studies reviewed yes  50% of 45 min visit spent on counseling and coordination of care.   Assessment & Plan    Healthy female exam.      Education reviewed: calcium supplements, depression evaluation, low fat, low cholesterol diet, self breast exams, skin cancer screening and weight bearing exercise. Contraception: status post hysterectomy. Hormone replacement therapy: hormone replacement therapy: Estradiol . Follow up in: 6 months.   Meds ordered this encounter  Medications  . Ospemifene (OSPHENA) 60 MG TABS    Sig: Take 1 tablet by mouth daily.    Dispense:  90 tablet    Refill:  4  . estradiol (ESTRACE) 1 MG tablet    Sig: Take 1 tablet (1 mg total) by mouth daily.    Dispense:  30 tablet    Refill:  12   Orders Placed This Encounter  Procedures  . TSH  . Hepatitis C antibody   Possible management options include: ?Gel hormone replacement, OTC herbals, gabapentin, Effexor Follow up as needed.

## 2017-02-22 ENCOUNTER — Other Ambulatory Visit: Payer: Self-pay | Admitting: Certified Nurse Midwife

## 2017-02-22 DIAGNOSIS — B9689 Other specified bacterial agents as the cause of diseases classified elsewhere: Secondary | ICD-10-CM

## 2017-02-22 DIAGNOSIS — N760 Acute vaginitis: Principal | ICD-10-CM

## 2017-02-22 LAB — CERVICOVAGINAL ANCILLARY ONLY
Bacterial vaginitis: POSITIVE — AB
CANDIDA VAGINITIS: NEGATIVE

## 2017-02-22 LAB — TSH: TSH: 2.19 u[IU]/mL (ref 0.450–4.500)

## 2017-02-22 LAB — HEPATITIS C ANTIBODY: Hep C Virus Ab: <0.1 s/co ratio (ref 0.0–0.9)

## 2017-02-22 MED ORDER — SECNIDAZOLE 2 G PO PACK
1.0000 | PACK | Freq: Once | ORAL | 0 refills | Status: AC
Start: 2017-02-22 — End: 2017-02-22

## 2017-03-01 ENCOUNTER — Telehealth: Payer: Self-pay

## 2017-03-01 ENCOUNTER — Other Ambulatory Visit: Payer: Self-pay | Admitting: Certified Nurse Midwife

## 2017-03-01 DIAGNOSIS — N760 Acute vaginitis: Principal | ICD-10-CM

## 2017-03-01 DIAGNOSIS — B9689 Other specified bacterial agents as the cause of diseases classified elsewhere: Secondary | ICD-10-CM

## 2017-03-01 MED ORDER — TINIDAZOLE 500 MG PO TABS: 2.0000 g | ORAL_TABLET | Freq: Every day | ORAL | 0 refills | Status: DC

## 2017-03-01 NOTE — Telephone Encounter (Signed)
TC from pt regarding Rx sent at her last visit for BV [facility]) cost is $165 Pt requesting a different Rx that is more cost effective for her.

## 2017-03-01 NOTE — Telephone Encounter (Signed)
Please tell that I am sorry it is not covered.  I have sent in Tindamax instead for her to take.  Thank you. Rachelle

## 2017-03-20 DIAGNOSIS — M50223 Other cervical disc displacement at C6-C7 level: Secondary | ICD-10-CM | POA: Diagnosis not present

## 2017-03-20 DIAGNOSIS — M79641 Pain in right hand: Secondary | ICD-10-CM | POA: Diagnosis not present

## 2017-04-17 DIAGNOSIS — K080 Exfoliation of teeth due to systemic causes: Secondary | ICD-10-CM | POA: Diagnosis not present

## 2017-04-19 DIAGNOSIS — K080 Exfoliation of teeth due to systemic causes: Secondary | ICD-10-CM | POA: Diagnosis not present

## 2017-04-20 DIAGNOSIS — M5412 Radiculopathy, cervical region: Secondary | ICD-10-CM | POA: Diagnosis not present

## 2017-04-25 DIAGNOSIS — M5412 Radiculopathy, cervical region: Secondary | ICD-10-CM | POA: Diagnosis not present

## 2017-04-27 DIAGNOSIS — M791 Myalgia, unspecified site: Secondary | ICD-10-CM | POA: Diagnosis not present

## 2017-04-27 DIAGNOSIS — E7800 Pure hypercholesterolemia, unspecified: Secondary | ICD-10-CM | POA: Diagnosis not present

## 2017-04-27 DIAGNOSIS — M5136 Other intervertebral disc degeneration, lumbar region: Secondary | ICD-10-CM | POA: Diagnosis not present

## 2017-04-27 DIAGNOSIS — M1990 Unspecified osteoarthritis, unspecified site: Secondary | ICD-10-CM | POA: Diagnosis not present

## 2017-04-28 DIAGNOSIS — M5412 Radiculopathy, cervical region: Secondary | ICD-10-CM | POA: Diagnosis not present

## 2017-05-02 DIAGNOSIS — M5412 Radiculopathy, cervical region: Secondary | ICD-10-CM | POA: Diagnosis not present

## 2017-05-04 DIAGNOSIS — M50223 Other cervical disc displacement at C6-C7 level: Secondary | ICD-10-CM | POA: Diagnosis not present

## 2017-05-04 DIAGNOSIS — R030 Elevated blood-pressure reading, without diagnosis of hypertension: Secondary | ICD-10-CM | POA: Diagnosis not present

## 2017-05-04 DIAGNOSIS — Z6828 Body mass index (BMI) 28.0-28.9, adult: Secondary | ICD-10-CM | POA: Diagnosis not present

## 2017-07-24 DIAGNOSIS — J45909 Unspecified asthma, uncomplicated: Secondary | ICD-10-CM | POA: Diagnosis not present

## 2017-07-24 DIAGNOSIS — E559 Vitamin D deficiency, unspecified: Secondary | ICD-10-CM | POA: Diagnosis not present

## 2017-07-24 DIAGNOSIS — R0600 Dyspnea, unspecified: Secondary | ICD-10-CM | POA: Diagnosis not present

## 2017-07-24 DIAGNOSIS — Z79899 Other long term (current) drug therapy: Secondary | ICD-10-CM | POA: Diagnosis not present

## 2017-07-24 DIAGNOSIS — M159 Polyosteoarthritis, unspecified: Secondary | ICD-10-CM | POA: Diagnosis not present

## 2017-07-24 DIAGNOSIS — Z8249 Family history of ischemic heart disease and other diseases of the circulatory system: Secondary | ICD-10-CM | POA: Diagnosis not present

## 2017-07-24 DIAGNOSIS — E7800 Pure hypercholesterolemia, unspecified: Secondary | ICD-10-CM | POA: Diagnosis not present

## 2017-08-07 ENCOUNTER — Other Ambulatory Visit: Payer: Self-pay | Admitting: Pulmonary Disease

## 2017-08-07 DIAGNOSIS — Z1231 Encounter for screening mammogram for malignant neoplasm of breast: Secondary | ICD-10-CM

## 2017-08-24 DIAGNOSIS — K080 Exfoliation of teeth due to systemic causes: Secondary | ICD-10-CM | POA: Diagnosis not present

## 2017-08-28 ENCOUNTER — Other Ambulatory Visit (HOSPITAL_COMMUNITY): Payer: Self-pay | Admitting: Pulmonary Disease

## 2017-08-28 ENCOUNTER — Ambulatory Visit (HOSPITAL_COMMUNITY)
Admission: RE | Admit: 2017-08-28 | Discharge: 2017-08-28 | Disposition: A | Discharge status: Home / Self Care | Payer: Federal, State, Local not specified - PPO | Source: Ambulatory Visit | Attending: Pulmonary Disease | Admitting: Pulmonary Disease

## 2017-08-28 DIAGNOSIS — R0602 Shortness of breath: Secondary | ICD-10-CM

## 2017-08-29 DIAGNOSIS — J4520 Mild intermittent asthma, uncomplicated: Secondary | ICD-10-CM | POA: Diagnosis not present

## 2017-08-29 DIAGNOSIS — J209 Acute bronchitis, unspecified: Secondary | ICD-10-CM | POA: Diagnosis not present

## 2017-08-29 DIAGNOSIS — R0600 Dyspnea, unspecified: Secondary | ICD-10-CM | POA: Diagnosis not present

## 2017-08-29 DIAGNOSIS — M159 Polyosteoarthritis, unspecified: Secondary | ICD-10-CM | POA: Diagnosis not present

## 2017-08-30 ENCOUNTER — Ambulatory Visit
Admission: RE | Admit: 2017-08-30 | Discharge: 2017-08-30 | Disposition: A | Discharge status: Home / Self Care | Payer: Federal, State, Local not specified - PPO | Source: Ambulatory Visit | Attending: Pulmonary Disease | Admitting: Pulmonary Disease

## 2017-08-30 DIAGNOSIS — Z1231 Encounter for screening mammogram for malignant neoplasm of breast: Secondary | ICD-10-CM | POA: Diagnosis not present

## 2017-08-30 IMAGING — DX DG CERVICAL SPINE COMPLETE 4+V
6 series · 6 of 6 positions shown · non-contrast
Comparison: None.

CLINICAL DATA: Pt states rt arm pains and numbness in rt [place] 1
mos, no known trauma, works in a mailroom sorting mail , and is rt
handed

EXAM:
CERVICAL SPINE - COMPLETE 4+ VIEW

[c-spine lat]
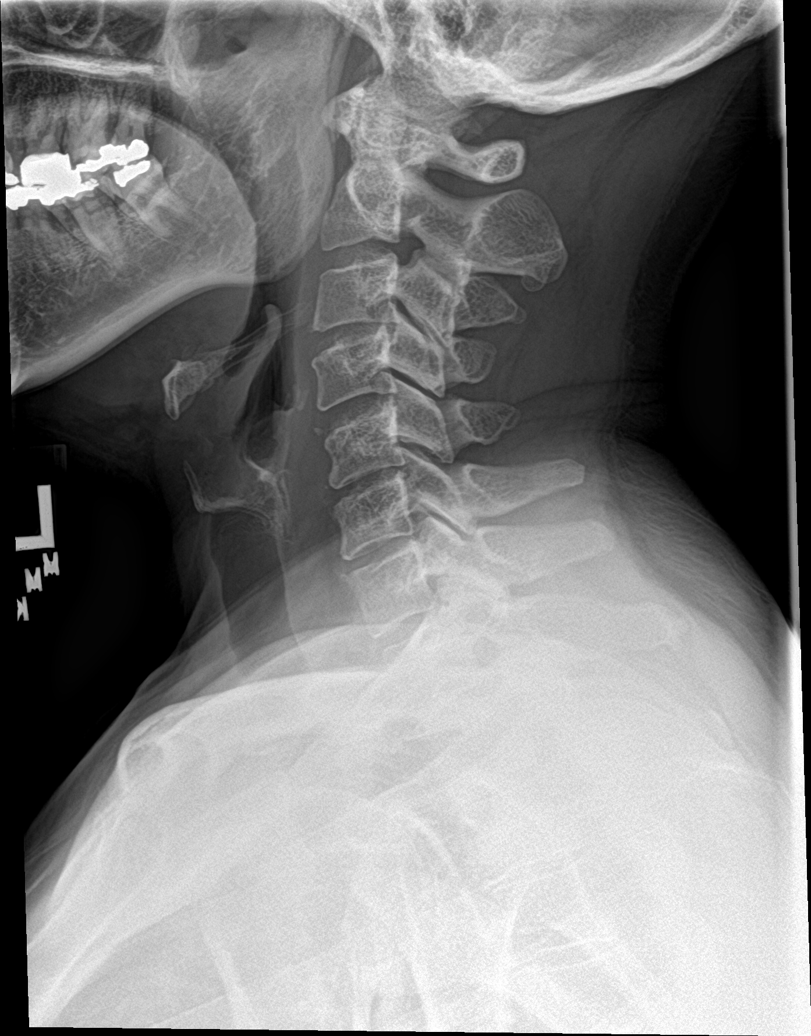

[c-spine obl (1 of 2)]
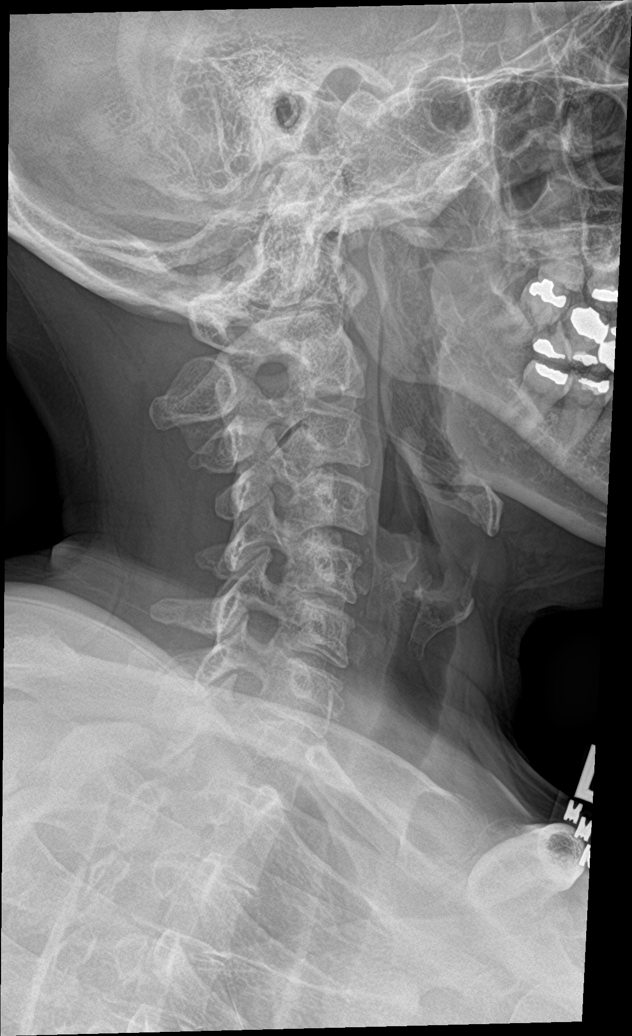

[c-spine obl (2 of 2)]
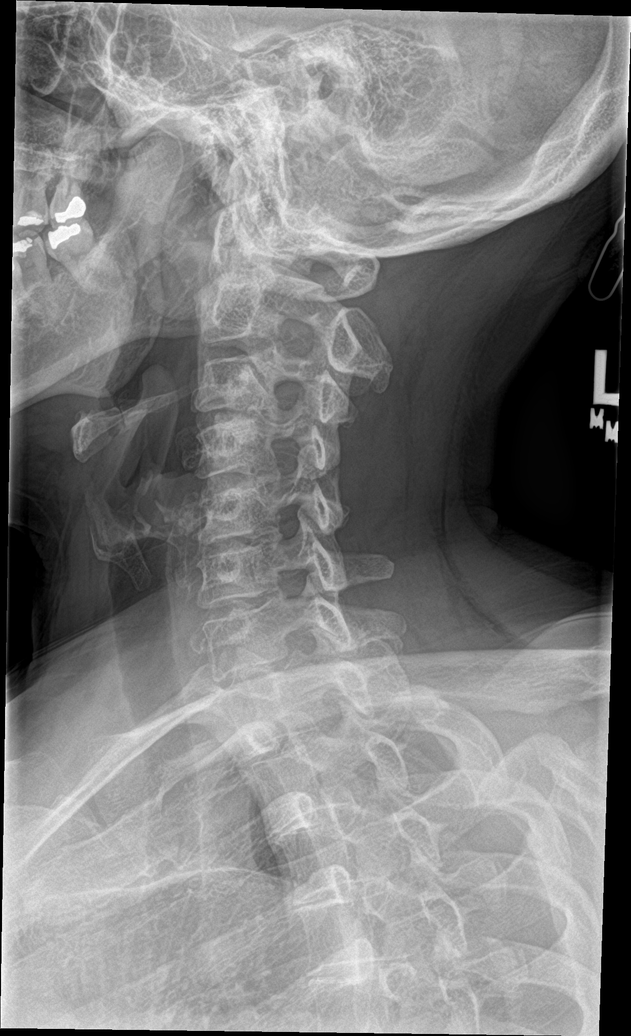

[c-spine ap (1 of 2)]
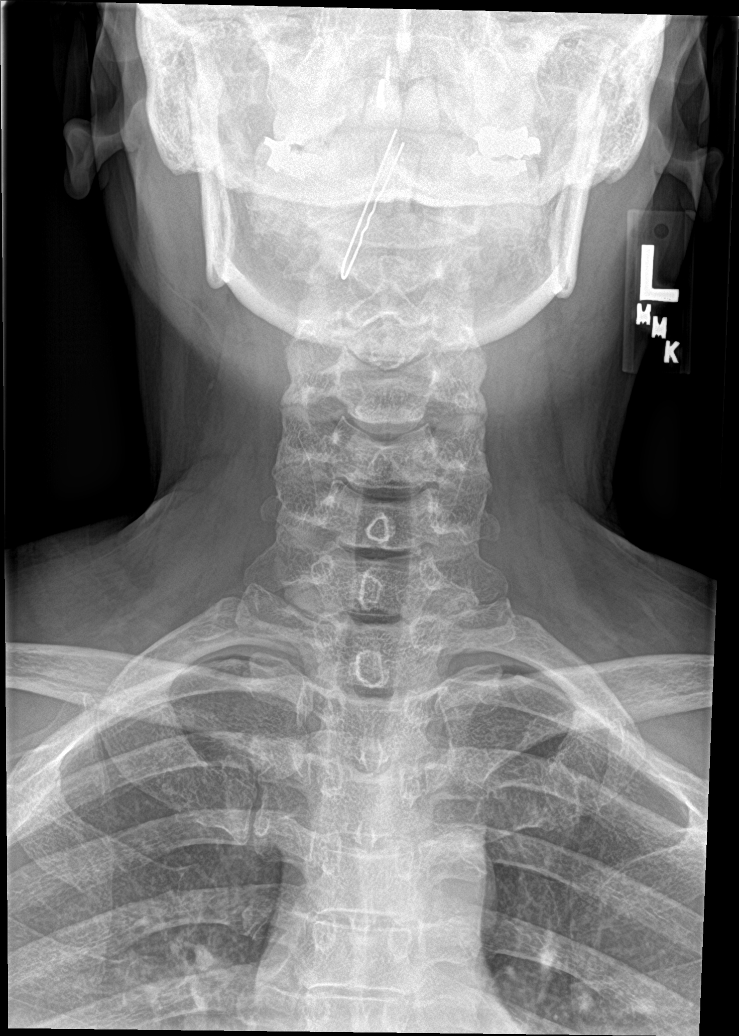

[c-spine open mouth]
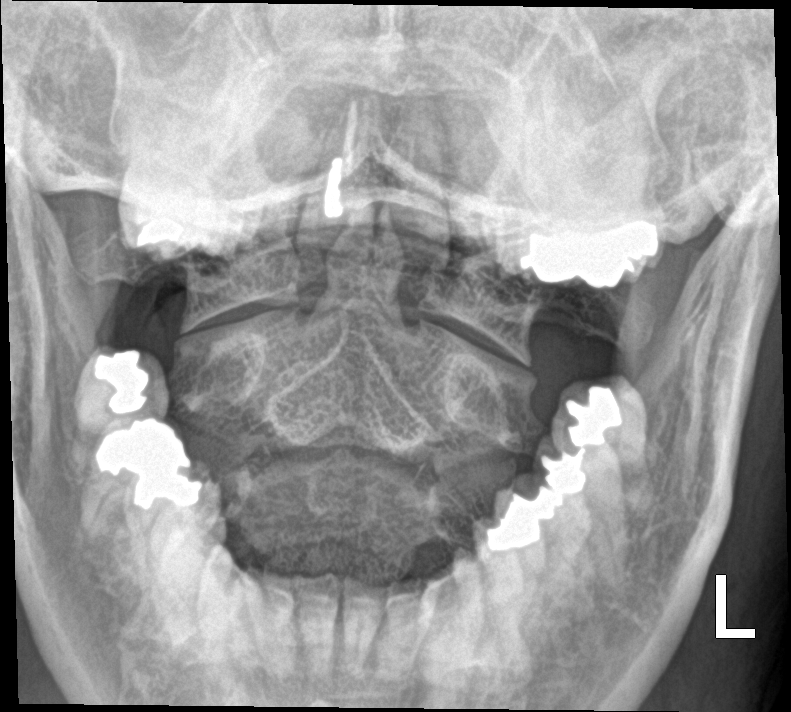

[c-spine ap (2 of 2)]
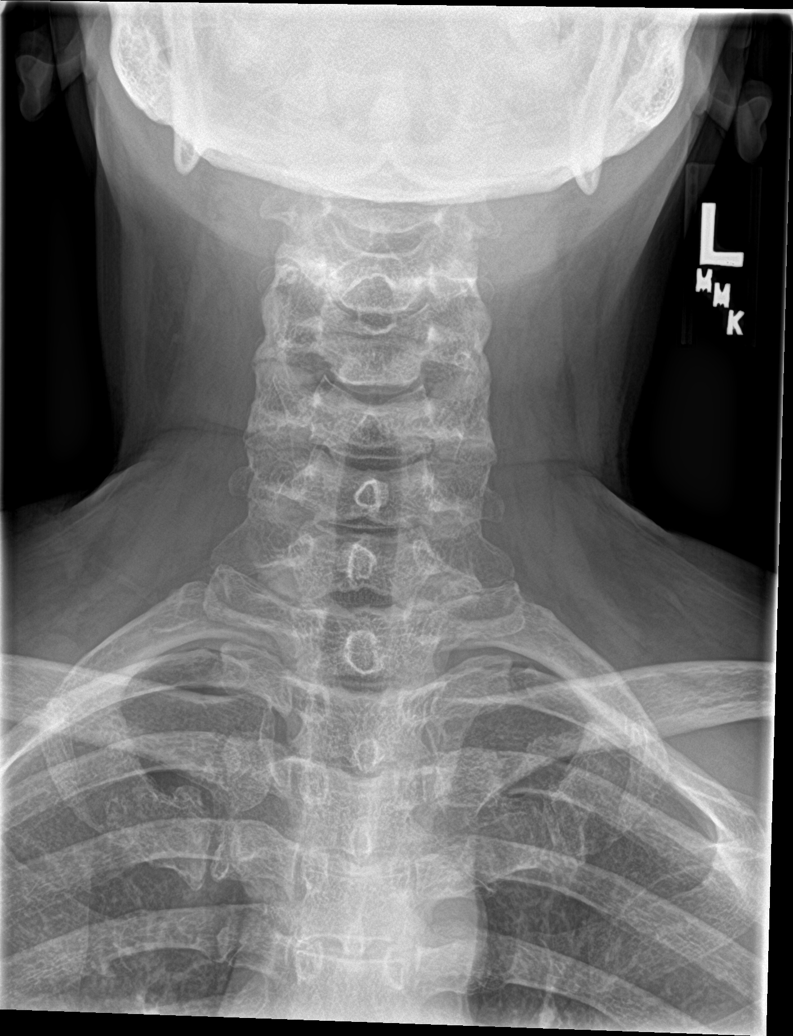

[6 of 6 positions shown; findings below may reference images not displayed]

FINDINGS: Mild disc desiccations at the C3-4, C5-6 and C6-7 levels as
manifested by slight disc space narrowings. No osteophytes or other
signs of advanced degenerative disc disease. No more than mild
osseous neural foramen narrowing seen at any level.

Slight retrolisthesis of C3 on C4, likely related to the underlying
degenerative change. Alignment appears otherwise normal. No acute or
suspicious osseous finding. Visualized paravertebral soft tissues
are unremarkable.
IMPRESSION: 1. Mild degenerative change, as detailed above.
2. No acute findings.

## 2017-09-22 ENCOUNTER — Other Ambulatory Visit: Payer: Self-pay

## 2017-09-22 DIAGNOSIS — N898 Other specified noninflammatory disorders of vagina: Secondary | ICD-10-CM

## 2017-09-22 MED ORDER — TINIDAZOLE 500 MG PO TABS: 2.0000 g | ORAL_TABLET | Freq: Every day | ORAL | 0 refills | Status: AC

## 2017-09-22 NOTE — Progress Notes (Signed)
TC from pt requesting Rx for BV sx's  Rx sent per protocol

## 2017-12-08 DIAGNOSIS — K080 Exfoliation of teeth due to systemic causes: Secondary | ICD-10-CM | POA: Diagnosis not present

## 2017-12-18 DIAGNOSIS — M159 Polyosteoarthritis, unspecified: Secondary | ICD-10-CM | POA: Diagnosis not present

## 2017-12-18 DIAGNOSIS — J309 Allergic rhinitis, unspecified: Secondary | ICD-10-CM | POA: Diagnosis not present

## 2017-12-18 DIAGNOSIS — J4520 Mild intermittent asthma, uncomplicated: Secondary | ICD-10-CM | POA: Diagnosis not present

## 2017-12-18 DIAGNOSIS — E559 Vitamin D deficiency, unspecified: Secondary | ICD-10-CM | POA: Diagnosis not present

## 2017-12-18 DIAGNOSIS — R0600 Dyspnea, unspecified: Secondary | ICD-10-CM | POA: Diagnosis not present

## 2017-12-18 DIAGNOSIS — J302 Other seasonal allergic rhinitis: Secondary | ICD-10-CM | POA: Diagnosis not present

## 2017-12-18 DIAGNOSIS — Z79899 Other long term (current) drug therapy: Secondary | ICD-10-CM | POA: Diagnosis not present

## 2017-12-18 DIAGNOSIS — E7800 Pure hypercholesterolemia, unspecified: Secondary | ICD-10-CM | POA: Diagnosis not present

## 2018-03-05 DIAGNOSIS — K080 Exfoliation of teeth due to systemic causes: Secondary | ICD-10-CM | POA: Diagnosis not present

## 2018-03-17 DIAGNOSIS — L03012 Cellulitis of left finger: Secondary | ICD-10-CM | POA: Diagnosis not present

## 2018-03-29 ENCOUNTER — Other Ambulatory Visit: Payer: Self-pay

## 2018-03-29 ENCOUNTER — Ambulatory Visit (HOSPITAL_COMMUNITY)
Admission: EM | Admit: 2018-03-29 | Discharge: 2018-03-29 | Disposition: A | Discharge status: Home / Self Care | Payer: Federal, State, Local not specified - PPO | Attending: Family Medicine | Admitting: Family Medicine

## 2018-03-29 ENCOUNTER — Encounter (HOSPITAL_COMMUNITY): Payer: Self-pay | Admitting: Emergency Medicine

## 2018-03-29 DIAGNOSIS — M545 Low back pain, unspecified: Secondary | ICD-10-CM

## 2018-03-29 DIAGNOSIS — K529 Noninfective gastroenteritis and colitis, unspecified: Secondary | ICD-10-CM

## 2018-03-29 MED ORDER — NAPROXEN 500 MG PO TABS: 500.0000 mg | ORAL_TABLET | Freq: Two times a day (BID) | ORAL | 0 refills | Status: AC

## 2018-03-29 MED ORDER — CYCLOBENZAPRINE HCL 10 MG PO TABS: 10.0000 mg | ORAL_TABLET | Freq: Every evening | ORAL | 0 refills | Status: DC | PRN

## 2018-03-29 NOTE — Discharge Instructions (Signed)

## 2018-03-29 NOTE — ED Triage Notes (Signed)
PT reports Monday morning she began vomiting and having diarrhea. She took pepto bismol Monday. PT reports diarrhea has been dark in color. BM color was brown today, with a few "black spots" today.   Lower back pain since Monday, this continues today.   Has not vomited or had diarrhea in 24 hours.

## 2018-03-29 NOTE — ED Provider Notes (Signed)
[name]   [id number] 03/29/18 Arrival Time: 3762  ASSESSMENT & PLAN:  1. Acute right-sided low back pain without sciatica    Suspect muscular etiology. Discussed. GI symptoms have completely resolved.  Able to ambulate here and hemodynamically stable. No indication for imaging of back at this time given no trauma and normal neurological exam. Discussed.  Meds ordered this encounter  Medications  . cyclobenzaprine (FLEXERIL) 10 MG tablet    Sig: Take 1 tablet (10 mg total) by mouth at bedtime as needed for muscle spasms.    Dispense:  10 tablet    Refill:  0  . naproxen (NAPROSYN) 500 MG tablet    Sig: Take 1 tablet (500 mg total) by mouth 2 (two) times daily.    Dispense:  20 tablet    Refill:  0   Medication sedation precautions given. Encourage ROM/movement as tolerated.  Follow-up Information    Vincente Liberty, MD.   Specialty:  Pulmonary Disease Why:  If not improving over the next week. Contact information: [place] [address] [phone]           Reviewed expectations re: course of current medical issues. Questions answered. Outlined signs and symptoms indicating need for more acute intervention. Patient verbalized understanding. After Visit Summary given.   SUBJECTIVE: History from: patient.  Annette Patterson is a 58 y.o. female who reports frequent episodes of n/v/d a few days ago. No abdominal pain. Afebrile. This has resolved. Over the past few days has noticed gradual onset of non-radiating right lower back pain described as an aching. Worse with certain movements. Occasional sharp exacerbation lasting seconds then going away. Ambulatory without difficulty. "Back feels stiff." No dysuria, urinary frequency, or hematuria. No vaginal discharge. Has not tried any OTC analgesics. Able to sleep through the night without her back pain waking her. No back trauma. Last BM today. Normal color and consistency.  Reports no  chronic steroid use, fevers, IV drug use, or recent back surgeries or procedures.  ROS: As per HPI. All other systems negative.   OBJECTIVE:  Vitals:   03/29/18 1304  BP: 129/75  Pulse: 64  Resp: 16  Temp: 97.6 F (36.4 C)  TempSrc: Oral  SpO2: 100%    General appearance: alert; no distress Oropharynx: moist Neck: supple with FROM; without midline tenderness CV: RRR without murmer Lungs: unlabored respirations; symmetrical air entry Abdomen: soft, non-tender; non-distended Back: moderate right sided tenderness of her lower right paraspinal musculature; FROM at waist; bruising: none; without midline tenderness Extremities: no edema; symmetrical with no gross deformities; normal ROM of bilateral lower extremities Skin: warm and dry Neurologic: normal gait; normal reflexes of RLE and LLE; normal sensation of RLE and LLE; normal strength of RLE and LLE Psychological: alert and cooperative; normal mood and affect   Allergies  Allergen Reactions  . Shellfish Allergy Hives    Past Medical History:  Diagnosis Date  . Hypercholesteremia    Social History   Socioeconomic History  . Marital status: Married    Spouse name: Not on file  . Number of children: Not on file  . Years of education: Not on file  . Highest education level: Not on file  Occupational History  . Not on file  Social Needs  . Financial resource strain: Not on file  . Food insecurity:    Worry: Not on file    Inability: Not on file  . Transportation needs:    Medical: Not on file  Non-medical: Not on file  Tobacco Use  . Smoking status: Former Research scientist (life sciences)  . Smokeless tobacco: Former Systems developer    Quit date: 01/25/1988  Substance and Sexual Activity  . Alcohol use: Yes    Alcohol/week: 2.0 standard drinks    Types: 2 Cans of beer per week  . Drug use: No  . Sexual activity: Yes    Partners: Male    Birth control/protection: Surgical  Lifestyle  . Physical activity:    Days per week: Not on file     Minutes per session: Not on file  . Stress: Not on file  Relationships  . Social connections:    Talks on phone: Not on file    Gets together: Not on file    Attends religious service: Not on file    Active member of club or organization: Not on file    Attends meetings of clubs or organizations: Not on file    Relationship status: Not on file  . Intimate partner violence:    Fear of current or ex partner: Not on file    Emotionally abused: Not on file    Physically abused: Not on file    Forced sexual activity: Not on file  Other Topics Concern  . Not on file  Social History Narrative  . Not on file   Family History  Problem Relation Age of Onset  . Hypertension Mother   . Asthma Mother   . Hypertension Father   . Breast cancer Maternal Aunt   . Breast cancer Cousin   . Breast cancer Maternal Aunt   . Breast cancer Cousin    Past Surgical History:  Procedure Laterality Date  . ABDOMINAL HYSTERECTOMY    . ROTATOR CUFF REPAIR       Vanessa Kick, MD 03/29/18 [phone]

## 2018-04-26 DIAGNOSIS — J4520 Mild intermittent asthma, uncomplicated: Secondary | ICD-10-CM | POA: Diagnosis not present

## 2018-04-26 DIAGNOSIS — Z79899 Other long term (current) drug therapy: Secondary | ICD-10-CM | POA: Diagnosis not present

## 2018-04-26 DIAGNOSIS — R0600 Dyspnea, unspecified: Secondary | ICD-10-CM | POA: Diagnosis not present

## 2018-04-26 DIAGNOSIS — M1990 Unspecified osteoarthritis, unspecified site: Secondary | ICD-10-CM | POA: Diagnosis not present

## 2018-08-01 ENCOUNTER — Other Ambulatory Visit: Payer: Self-pay | Admitting: Pulmonary Disease

## 2018-08-01 DIAGNOSIS — Z1231 Encounter for screening mammogram for malignant neoplasm of breast: Secondary | ICD-10-CM

## 2018-08-14 DIAGNOSIS — R0600 Dyspnea, unspecified: Secondary | ICD-10-CM | POA: Diagnosis not present

## 2018-08-14 DIAGNOSIS — M159 Polyosteoarthritis, unspecified: Secondary | ICD-10-CM | POA: Diagnosis not present

## 2018-08-14 DIAGNOSIS — J4520 Mild intermittent asthma, uncomplicated: Secondary | ICD-10-CM | POA: Diagnosis not present

## 2018-08-14 DIAGNOSIS — Z79899 Other long term (current) drug therapy: Secondary | ICD-10-CM | POA: Diagnosis not present

## 2018-09-17 ENCOUNTER — Other Ambulatory Visit: Payer: Self-pay

## 2018-09-17 ENCOUNTER — Ambulatory Visit
Admission: RE | Admit: 2018-09-17 | Discharge: 2018-09-17 | Disposition: A | Discharge status: Home / Self Care | Payer: Federal, State, Local not specified - PPO | Source: Ambulatory Visit | Attending: Pulmonary Disease | Admitting: Pulmonary Disease

## 2018-09-17 DIAGNOSIS — Z1231 Encounter for screening mammogram for malignant neoplasm of breast: Secondary | ICD-10-CM | POA: Diagnosis not present

## 2018-09-19 ENCOUNTER — Other Ambulatory Visit: Payer: Self-pay | Admitting: Pulmonary Disease

## 2018-09-19 DIAGNOSIS — R928 Other abnormal and inconclusive findings on diagnostic imaging of breast: Secondary | ICD-10-CM

## 2018-09-26 ENCOUNTER — Other Ambulatory Visit: Payer: Self-pay

## 2018-09-26 ENCOUNTER — Ambulatory Visit
Admission: RE | Admit: 2018-09-26 | Discharge: 2018-09-26 | Disposition: A | Discharge status: Home / Self Care | Payer: Federal, State, Local not specified - PPO | Source: Ambulatory Visit | Attending: Pulmonary Disease | Admitting: Pulmonary Disease

## 2018-09-26 ENCOUNTER — Ambulatory Visit: Payer: Federal, State, Local not specified - PPO

## 2018-09-26 DIAGNOSIS — R928 Other abnormal and inconclusive findings on diagnostic imaging of breast: Secondary | ICD-10-CM

## 2018-10-23 DIAGNOSIS — J4521 Mild intermittent asthma with (acute) exacerbation: Secondary | ICD-10-CM | POA: Diagnosis not present

## 2018-10-23 DIAGNOSIS — Z79899 Other long term (current) drug therapy: Secondary | ICD-10-CM | POA: Diagnosis not present

## 2018-10-23 DIAGNOSIS — M1990 Unspecified osteoarthritis, unspecified site: Secondary | ICD-10-CM | POA: Diagnosis not present

## 2018-10-23 DIAGNOSIS — E7800 Pure hypercholesterolemia, unspecified: Secondary | ICD-10-CM | POA: Diagnosis not present

## 2018-12-17 ENCOUNTER — Other Ambulatory Visit: Payer: Self-pay | Admitting: Pulmonary Disease

## 2018-12-17 ENCOUNTER — Other Ambulatory Visit (HOSPITAL_COMMUNITY): Payer: Self-pay | Admitting: Pulmonary Disease

## 2018-12-17 DIAGNOSIS — R319 Hematuria, unspecified: Secondary | ICD-10-CM

## 2018-12-17 DIAGNOSIS — M159 Polyosteoarthritis, unspecified: Secondary | ICD-10-CM | POA: Diagnosis not present

## 2018-12-17 DIAGNOSIS — R109 Unspecified abdominal pain: Secondary | ICD-10-CM

## 2018-12-17 DIAGNOSIS — E7800 Pure hypercholesterolemia, unspecified: Secondary | ICD-10-CM | POA: Diagnosis not present

## 2018-12-17 DIAGNOSIS — R3129 Other microscopic hematuria: Secondary | ICD-10-CM | POA: Diagnosis not present

## 2018-12-17 DIAGNOSIS — E559 Vitamin D deficiency, unspecified: Secondary | ICD-10-CM | POA: Diagnosis not present

## 2018-12-17 DIAGNOSIS — Z8249 Family history of ischemic heart disease and other diseases of the circulatory system: Secondary | ICD-10-CM | POA: Diagnosis not present

## 2018-12-17 DIAGNOSIS — J4520 Mild intermittent asthma, uncomplicated: Secondary | ICD-10-CM | POA: Diagnosis not present

## 2018-12-17 DIAGNOSIS — Z79899 Other long term (current) drug therapy: Secondary | ICD-10-CM | POA: Diagnosis not present

## 2018-12-25 ENCOUNTER — Other Ambulatory Visit: Payer: Self-pay

## 2018-12-25 ENCOUNTER — Ambulatory Visit (HOSPITAL_COMMUNITY)
Admission: RE | Admit: 2018-12-25 | Discharge: 2018-12-25 | Disposition: A | Discharge status: Home / Self Care | Payer: Federal, State, Local not specified - PPO | Source: Ambulatory Visit | Attending: Pulmonary Disease | Admitting: Pulmonary Disease

## 2018-12-25 DIAGNOSIS — R319 Hematuria, unspecified: Secondary | ICD-10-CM | POA: Insufficient documentation

## 2018-12-25 DIAGNOSIS — R109 Unspecified abdominal pain: Secondary | ICD-10-CM | POA: Insufficient documentation

## 2018-12-25 MED ORDER — SODIUM CHLORIDE (PF) 0.9 % IJ SOLN
INTRAMUSCULAR | Status: AC
  Filled 2018-12-25: qty 50

## 2018-12-25 MED ORDER — SODIUM CHLORIDE 0.9 % IV SOLN
INTRAVENOUS | Status: AC
  Filled 2018-12-25: qty 250

## 2018-12-25 MED ORDER — IOHEXOL 300 MG/ML  SOLN
100.0000 mL | Freq: Once | INTRAMUSCULAR | Status: AC | PRN
  Administered 2018-12-25: 09:00:00 100 mL via INTRAVENOUS

## 2019-02-13 ENCOUNTER — Other Ambulatory Visit: Payer: Self-pay

## 2019-02-13 ENCOUNTER — Ambulatory Visit (HOSPITAL_COMMUNITY)
Admission: EM | Admit: 2019-02-13 | Discharge: 2019-02-13 | Disposition: A | Discharge status: Home / Self Care | Payer: Federal, State, Local not specified - PPO | Attending: Family Medicine | Admitting: Family Medicine

## 2019-02-13 ENCOUNTER — Encounter (HOSPITAL_COMMUNITY): Payer: Self-pay

## 2019-02-13 DIAGNOSIS — Z91013 Allergy to seafood: Secondary | ICD-10-CM | POA: Diagnosis not present

## 2019-02-13 DIAGNOSIS — Z87891 Personal history of nicotine dependence: Secondary | ICD-10-CM | POA: Diagnosis not present

## 2019-02-13 DIAGNOSIS — R030 Elevated blood-pressure reading, without diagnosis of hypertension: Secondary | ICD-10-CM | POA: Diagnosis not present

## 2019-02-13 DIAGNOSIS — R0981 Nasal congestion: Secondary | ICD-10-CM | POA: Diagnosis not present

## 2019-02-13 DIAGNOSIS — U071 COVID-19: Secondary | ICD-10-CM | POA: Diagnosis not present

## 2019-02-13 DIAGNOSIS — J029 Acute pharyngitis, unspecified: Secondary | ICD-10-CM | POA: Diagnosis not present

## 2019-02-13 DIAGNOSIS — Z8249 Family history of ischemic heart disease and other diseases of the circulatory system: Secondary | ICD-10-CM | POA: Diagnosis not present

## 2019-02-13 DIAGNOSIS — Z825 Family history of asthma and other chronic lower respiratory diseases: Secondary | ICD-10-CM | POA: Insufficient documentation

## 2019-02-13 DIAGNOSIS — R6883 Chills (without fever): Secondary | ICD-10-CM | POA: Insufficient documentation

## 2019-02-13 NOTE — Discharge Instructions (Addendum)
You have been tested for COVID-19 today. If your test returns positive, you will receive a phone call from [facility] regarding your results. Negative test results are not called. Both positive and negative results area always visible on MyChart. If you do not have a MyChart account, sign up instructions are provided in your discharge papers. Please do not hesitate to contact us should you have questions or concerns.  Your blood pressure was noted to be elevated during your visit today. You may return here within the next few days to recheck if unable to see your primary care doctor. If your blood pressure remains persistently elevated, you may need to begin taking a medication.  BP (!) 127/103 (BP Location: Left Arm)   Pulse 83   Temp 98.5 F (36.9 C) (Oral)   SpO2 97%

## 2019-02-13 NOTE — ED Provider Notes (Signed)
[place]   [id number] 02/13/19 Arrival Time: [id number]  ASSESSMENT & PLAN:  1. Sore throat   2. Chills   3. Nasal congestion   4. Elevated blood pressure reading without diagnosis of hypertension      COVID-19 testing sent. See letter/work note on file for self-isolation guidelines.   Follow-up Information    Vincente Liberty, MD.   Specialty: Pulmonary Disease Why: As needed. Contact information: [place] [address] [phone]             Discharge Instructions     You have been tested for COVID-19 today. If your test returns positive, you will receive a phone call from [facility] regarding your results. Negative test results are not called. Both positive and negative results area always visible on MyChart. If you do not have a MyChart account, sign up instructions are provided in your discharge papers. Please do not hesitate to contact us should you have questions or concerns.  Your blood pressure was noted to be elevated during your visit today. You may return here within the next few days to recheck if unable to see your primary care doctor. If your blood pressure remains persistently elevated, you may need to begin taking a medication.  BP (!) 127/103 (BP Location: Left Arm)   Pulse 83   Temp 98.5 F (36.9 C) (Oral)   SpO2 97%       Reviewed expectations re: course of current medical issues. Questions answered. Outlined signs and symptoms indicating need for more acute intervention. Patient verbalized understanding. After Visit Summary given.   SUBJECTIVE: History from: patient. Jiah NADIRAH FINNEN is a 59 y.o. female who requests COVID-19 testing. Known COVID-19 contact: none. Recent travel: none. Denies: difficulty breathing and headache. Reports abrupt onset of nasal congestion, chills, body aches yesterday; same today. Normal PO intake without n/v/d.  Increased blood pressure noted today. Reports that she has not  been treated for hypertension in the past. Reports no chest pain on exertion, no dyspnea on exertion, no swelling of ankles, no orthostatic dizziness or lightheadedness, no orthopnea or paroxysmal nocturnal dyspnea, no palpitations and no intermittent claudication symptoms.  ROS: As per HPI.   OBJECTIVE:  Vitals:   02/13/19 1624  BP: (!) 127/103  Pulse: 83  Temp: 98.5 F (36.9 C)  TempSrc: Oral  SpO2: 97%    General appearance: alert; no distress Eyes: PERRLA; EOMI; conjunctiva normal HENT: [place]; AT; nasal mucosa normal; oral mucosa normal Neck: supple  Lungs: speaks full sentences without difficulty; unlabored Extremities: no edema Skin: warm and dry Neurologic: normal gait Psychological: alert and cooperative; normal mood and affect  Labs:  Labs Reviewed  NOVEL CORONAVIRUS, NAA (HOSP ORDER, SEND-OUT TO REF LAB; TAT 18-24 HRS)     Allergies  Allergen Reactions  . Shellfish Allergy Hives    Past Medical History:  Diagnosis Date  . Hypercholesteremia    Social History   Socioeconomic History  . Marital status: Married    Spouse name: Not on file  . Number of children: Not on file  . Years of education: Not on file  . Highest education level: Not on file  Occupational History  . Not on file  Tobacco Use  . Smoking status: Former Research scientist (life sciences)  . Smokeless tobacco: Former Systems developer    Quit date: 01/25/1988  Substance and Sexual Activity  . Alcohol use: Yes    Alcohol/week: 2.0 standard drinks    Types: 2 Cans of beer per week  .  Drug use: No  . Sexual activity: Yes    Partners: Male    Birth control/protection: Surgical  Other Topics Concern  . Not on file  Social History Narrative  . Not on file   Social Determinants of Health   Financial Resource Strain:   . Difficulty of Paying Living Expenses: Not on file  Food Insecurity:   . Worried About Charity fundraiser in the Last Year: Not on file  . Ran Out of Food in the Last Year: Not on file  Transportation  Needs:   . Lack of Transportation (Medical): Not on file  . Lack of Transportation (Non-Medical): Not on file  Physical Activity:   . Days of Exercise per Week: Not on file  . Minutes of Exercise per Session: Not on file  Stress:   . Feeling of Stress : Not on file  Social Connections:   . Frequency of Communication with Friends and Family: Not on file  . Frequency of Social Gatherings with Friends and Family: Not on file  . Attends Religious Services: Not on file  . Active Member of Clubs or Organizations: Not on file  . Attends Archivist Meetings: Not on file  . Marital Status: Not on file  Intimate Partner Violence:   . Fear of Current or Ex-Partner: Not on file  . Emotionally Abused: Not on file  . Physically Abused: Not on file  . Sexually Abused: Not on file   Family History  Problem Relation Age of Onset  . Hypertension Mother   . Asthma Mother   . Hypertension Father   . Breast cancer Maternal Aunt   . Breast cancer Cousin   . Breast cancer Maternal Aunt   . Breast cancer Cousin    Past Surgical History:  Procedure Laterality Date  . ABDOMINAL HYSTERECTOMY    . ROTATOR CUFF REPAIR       Vanessa Kick, MD 02/13/19 [phone]

## 2019-02-13 NOTE — ED Triage Notes (Signed)
Pt c/o congestion, sore throat, chills,and gen body aches since yesterday. Denies abd pain, n/v/d or HA. No SOB, CP.

## 2019-02-14 LAB — NOVEL CORONAVIRUS, NAA (HOSP ORDER, SEND-OUT TO REF LAB; TAT 18-24 HRS): SARS-CoV-2, NAA: DETECTED — AB

## 2019-02-15 ENCOUNTER — Telehealth: Payer: Self-pay | Admitting: *Deleted

## 2019-02-15 NOTE — Telephone Encounter (Signed)
She called in requesting her COVID-19 test result.   I let her know it was detected meaning she has the virus.   I went over the 10 day quarantine protocol.  I let her know after 10 days she could end quarantine which would be 02/21/2019 as long as her symptoms (No smell, body aches, nasal congestion, and cough) were resolving and she remains fever free for 3 straight days.   I instructed her to seek medical attention if she develops shortness of breath or chest tightness/pressure.   Advised she can use OTC medications for her symptoms.  I went over the cleaning of surfaces, wearing masks, washing hands frequently, and social distancing.   Her husband is going to be tested.   He is not having symptoms.   She said she would isolate herself in her bedroom.    I answered her questions.   She verbalized understanding.  [employer] [facility] notified.

## 2019-04-04 ENCOUNTER — Ambulatory Visit: Payer: Federal, State, Local not specified - PPO

## 2019-04-15 DIAGNOSIS — J4521 Mild intermittent asthma with (acute) exacerbation: Secondary | ICD-10-CM | POA: Diagnosis not present

## 2019-04-15 DIAGNOSIS — E7800 Pure hypercholesterolemia, unspecified: Secondary | ICD-10-CM | POA: Diagnosis not present

## 2019-04-15 DIAGNOSIS — Z79899 Other long term (current) drug therapy: Secondary | ICD-10-CM | POA: Diagnosis not present

## 2019-04-15 DIAGNOSIS — M159 Polyosteoarthritis, unspecified: Secondary | ICD-10-CM | POA: Diagnosis not present

## 2019-06-17 DIAGNOSIS — K080 Exfoliation of teeth due to systemic causes: Secondary | ICD-10-CM | POA: Diagnosis not present

## 2019-09-09 DIAGNOSIS — M159 Polyosteoarthritis, unspecified: Secondary | ICD-10-CM | POA: Diagnosis not present

## 2019-09-09 DIAGNOSIS — M545 Low back pain: Secondary | ICD-10-CM | POA: Diagnosis not present

## 2019-09-09 DIAGNOSIS — J4520 Mild intermittent asthma, uncomplicated: Secondary | ICD-10-CM | POA: Diagnosis not present

## 2019-09-09 DIAGNOSIS — Z79899 Other long term (current) drug therapy: Secondary | ICD-10-CM | POA: Diagnosis not present

## 2019-09-09 DIAGNOSIS — E7800 Pure hypercholesterolemia, unspecified: Secondary | ICD-10-CM | POA: Diagnosis not present

## 2019-10-04 ENCOUNTER — Other Ambulatory Visit: Payer: Self-pay

## 2019-10-04 ENCOUNTER — Ambulatory Visit (HOSPITAL_COMMUNITY)
Admission: EM | Admit: 2019-10-04 | Discharge: 2019-10-04 | Disposition: A | Discharge status: Home / Self Care | Payer: Federal, State, Local not specified - PPO | Attending: Internal Medicine | Admitting: Internal Medicine

## 2019-10-04 DIAGNOSIS — Z20822 Contact with and (suspected) exposure to covid-19: Secondary | ICD-10-CM | POA: Diagnosis not present

## 2019-10-04 LAB — SARS CORONAVIRUS 2 (TAT 6-24 HRS): SARS Coronavirus 2: NEGATIVE

## 2019-10-04 NOTE — Discharge Instructions (Signed)
If your Covid-19 test is positive, you will get a phone call from [facility] regarding your results. If your Covid-19 test is negative, you will NOT get a phone call from [facility] with your results. You may view your results on MyChart. If you do not have a MyChart account, sign up instructions are in your discharge papers. ° °

## 2019-10-04 NOTE — ED Triage Notes (Signed)
Pt presents for covid testing with no known symptoms. 

## 2019-10-18 ENCOUNTER — Other Ambulatory Visit: Payer: Self-pay | Admitting: Pulmonary Disease

## 2019-10-18 DIAGNOSIS — Z1231 Encounter for screening mammogram for malignant neoplasm of breast: Secondary | ICD-10-CM

## 2019-11-14 ENCOUNTER — Ambulatory Visit: Payer: Federal, State, Local not specified - PPO

## 2019-12-12 ENCOUNTER — Ambulatory Visit: Payer: Federal, State, Local not specified - PPO

## 2019-12-31 DIAGNOSIS — M159 Polyosteoarthritis, unspecified: Secondary | ICD-10-CM | POA: Diagnosis not present

## 2019-12-31 DIAGNOSIS — J4520 Mild intermittent asthma, uncomplicated: Secondary | ICD-10-CM | POA: Diagnosis not present

## 2019-12-31 DIAGNOSIS — E7800 Pure hypercholesterolemia, unspecified: Secondary | ICD-10-CM | POA: Diagnosis not present

## 2019-12-31 DIAGNOSIS — Z79899 Other long term (current) drug therapy: Secondary | ICD-10-CM | POA: Diagnosis not present

## 2020-01-22 ENCOUNTER — Other Ambulatory Visit: Payer: Self-pay

## 2020-01-22 ENCOUNTER — Ambulatory Visit
Admission: RE | Admit: 2020-01-22 | Discharge: 2020-01-22 | Disposition: A | Discharge status: Home / Self Care | Payer: Federal, State, Local not specified - PPO | Source: Ambulatory Visit | Attending: Pulmonary Disease | Admitting: Pulmonary Disease

## 2020-01-22 DIAGNOSIS — Z1231 Encounter for screening mammogram for malignant neoplasm of breast: Secondary | ICD-10-CM | POA: Diagnosis not present

## 2020-03-31 DIAGNOSIS — J4520 Mild intermittent asthma, uncomplicated: Secondary | ICD-10-CM | POA: Diagnosis not present

## 2020-03-31 DIAGNOSIS — E559 Vitamin D deficiency, unspecified: Secondary | ICD-10-CM | POA: Diagnosis not present

## 2020-03-31 DIAGNOSIS — E7800 Pure hypercholesterolemia, unspecified: Secondary | ICD-10-CM | POA: Diagnosis not present

## 2020-03-31 DIAGNOSIS — Z79899 Other long term (current) drug therapy: Secondary | ICD-10-CM | POA: Diagnosis not present

## 2020-03-31 DIAGNOSIS — M159 Polyosteoarthritis, unspecified: Secondary | ICD-10-CM | POA: Diagnosis not present

## 2020-03-31 DIAGNOSIS — Z8249 Family history of ischemic heart disease and other diseases of the circulatory system: Secondary | ICD-10-CM | POA: Diagnosis not present

## 2020-09-08 DIAGNOSIS — Z79899 Other long term (current) drug therapy: Secondary | ICD-10-CM | POA: Diagnosis not present

## 2020-09-08 DIAGNOSIS — M159 Polyosteoarthritis, unspecified: Secondary | ICD-10-CM | POA: Diagnosis not present

## 2020-09-08 DIAGNOSIS — E7800 Pure hypercholesterolemia, unspecified: Secondary | ICD-10-CM | POA: Diagnosis not present

## 2020-09-08 DIAGNOSIS — J4520 Mild intermittent asthma, uncomplicated: Secondary | ICD-10-CM | POA: Diagnosis not present

## 2020-11-06 ENCOUNTER — Ambulatory Visit (HOSPITAL_COMMUNITY)
Admission: RE | Admit: 2020-11-06 | Discharge: 2020-11-06 | Disposition: A | Discharge status: Home / Self Care | Payer: Federal, State, Local not specified - PPO | Source: Ambulatory Visit | Attending: Pulmonary Disease | Admitting: Pulmonary Disease

## 2020-11-06 ENCOUNTER — Other Ambulatory Visit (HOSPITAL_COMMUNITY): Payer: Self-pay | Admitting: Pulmonary Disease

## 2020-11-06 ENCOUNTER — Other Ambulatory Visit: Payer: Self-pay

## 2020-11-06 DIAGNOSIS — R0609 Other forms of dyspnea: Secondary | ICD-10-CM | POA: Diagnosis not present

## 2020-11-06 DIAGNOSIS — M19011 Primary osteoarthritis, right shoulder: Secondary | ICD-10-CM | POA: Diagnosis not present

## 2020-11-06 DIAGNOSIS — M19012 Primary osteoarthritis, left shoulder: Secondary | ICD-10-CM | POA: Diagnosis not present

## 2020-12-15 DIAGNOSIS — Z1211 Encounter for screening for malignant neoplasm of colon: Secondary | ICD-10-CM | POA: Diagnosis not present

## 2020-12-15 DIAGNOSIS — D123 Benign neoplasm of transverse colon: Secondary | ICD-10-CM | POA: Diagnosis not present

## 2021-03-16 DIAGNOSIS — Z8249 Family history of ischemic heart disease and other diseases of the circulatory system: Secondary | ICD-10-CM | POA: Diagnosis not present

## 2021-03-16 DIAGNOSIS — E559 Vitamin D deficiency, unspecified: Secondary | ICD-10-CM | POA: Diagnosis not present

## 2021-03-16 DIAGNOSIS — J4520 Mild intermittent asthma, uncomplicated: Secondary | ICD-10-CM | POA: Diagnosis not present

## 2021-03-16 DIAGNOSIS — E7800 Pure hypercholesterolemia, unspecified: Secondary | ICD-10-CM | POA: Diagnosis not present

## 2021-03-16 DIAGNOSIS — Z79899 Other long term (current) drug therapy: Secondary | ICD-10-CM | POA: Diagnosis not present

## 2021-03-16 DIAGNOSIS — M1990 Unspecified osteoarthritis, unspecified site: Secondary | ICD-10-CM | POA: Diagnosis not present

## 2021-07-15 DIAGNOSIS — J4520 Mild intermittent asthma, uncomplicated: Secondary | ICD-10-CM | POA: Diagnosis not present

## 2021-07-15 DIAGNOSIS — E7800 Pure hypercholesterolemia, unspecified: Secondary | ICD-10-CM | POA: Diagnosis not present

## 2021-07-15 DIAGNOSIS — Z79899 Other long term (current) drug therapy: Secondary | ICD-10-CM | POA: Diagnosis not present

## 2021-07-15 DIAGNOSIS — M1990 Unspecified osteoarthritis, unspecified site: Secondary | ICD-10-CM | POA: Diagnosis not present

## 2021-07-15 DIAGNOSIS — R3129 Other microscopic hematuria: Secondary | ICD-10-CM | POA: Diagnosis not present

## 2021-11-16 ENCOUNTER — Other Ambulatory Visit (HOSPITAL_COMMUNITY): Payer: Self-pay | Admitting: Pulmonary Disease

## 2021-11-16 DIAGNOSIS — Z79899 Other long term (current) drug therapy: Secondary | ICD-10-CM | POA: Diagnosis not present

## 2021-11-16 DIAGNOSIS — M1990 Unspecified osteoarthritis, unspecified site: Secondary | ICD-10-CM | POA: Diagnosis not present

## 2021-11-16 DIAGNOSIS — E7800 Pure hypercholesterolemia, unspecified: Secondary | ICD-10-CM | POA: Diagnosis not present

## 2021-11-16 DIAGNOSIS — R3129 Other microscopic hematuria: Secondary | ICD-10-CM

## 2021-11-16 DIAGNOSIS — J4520 Mild intermittent asthma, uncomplicated: Secondary | ICD-10-CM | POA: Diagnosis not present

## 2021-11-25 ENCOUNTER — Ambulatory Visit (HOSPITAL_COMMUNITY)
Admission: RE | Admit: 2021-11-25 | Discharge: 2021-11-25 | Disposition: A | Discharge status: Home / Self Care | Payer: Federal, State, Local not specified - PPO | Source: Ambulatory Visit | Attending: Pulmonary Disease | Admitting: Pulmonary Disease

## 2021-11-25 ENCOUNTER — Other Ambulatory Visit (HOSPITAL_COMMUNITY): Payer: Self-pay | Admitting: Pulmonary Disease

## 2021-11-25 DIAGNOSIS — R3129 Other microscopic hematuria: Secondary | ICD-10-CM

## 2021-11-25 DIAGNOSIS — N281 Cyst of kidney, acquired: Secondary | ICD-10-CM | POA: Diagnosis not present

## 2021-11-25 DIAGNOSIS — N2889 Other specified disorders of kidney and ureter: Secondary | ICD-10-CM | POA: Diagnosis not present

## 2021-11-25 MED ORDER — IOHEXOL 350 MG/ML SOLN
100.0000 mL | Freq: Once | INTRAVENOUS | Status: AC | PRN
  Administered 2021-11-25: 100 mL via INTRAVENOUS

## 2022-01-10 ENCOUNTER — Other Ambulatory Visit (HOSPITAL_COMMUNITY): Payer: Self-pay | Admitting: Neurosurgery

## 2022-01-10 DIAGNOSIS — M25512 Pain in left shoulder: Secondary | ICD-10-CM

## 2022-01-10 DIAGNOSIS — M50223 Other cervical disc displacement at C6-C7 level: Secondary | ICD-10-CM | POA: Diagnosis not present

## 2022-01-28 ENCOUNTER — Ambulatory Visit (HOSPITAL_COMMUNITY)
Admission: RE | Admit: 2022-01-28 | Discharge: 2022-01-28 | Disposition: A | Discharge status: Home / Self Care | Payer: Federal, State, Local not specified - PPO | Source: Ambulatory Visit | Attending: Neurosurgery | Admitting: Neurosurgery

## 2022-01-28 DIAGNOSIS — M50223 Other cervical disc displacement at C6-C7 level: Secondary | ICD-10-CM | POA: Diagnosis not present

## 2022-01-28 DIAGNOSIS — M5021 Other cervical disc displacement,  high cervical region: Secondary | ICD-10-CM | POA: Diagnosis not present

## 2022-01-28 DIAGNOSIS — R200 Anesthesia of skin: Secondary | ICD-10-CM | POA: Diagnosis not present

## 2022-01-28 DIAGNOSIS — R531 Weakness: Secondary | ICD-10-CM | POA: Diagnosis not present

## 2022-01-28 DIAGNOSIS — S46012A Strain of muscle(s) and tendon(s) of the rotator cuff of left shoulder, initial encounter: Secondary | ICD-10-CM | POA: Diagnosis not present

## 2022-01-28 DIAGNOSIS — M50221 Other cervical disc displacement at C4-C5 level: Secondary | ICD-10-CM | POA: Diagnosis not present

## 2022-01-28 DIAGNOSIS — M50222 Other cervical disc displacement at C5-C6 level: Secondary | ICD-10-CM | POA: Diagnosis not present

## 2022-01-28 DIAGNOSIS — M25512 Pain in left shoulder: Secondary | ICD-10-CM | POA: Insufficient documentation

## 2022-01-28 DIAGNOSIS — M6258 Muscle wasting and atrophy, not elsewhere classified, other site: Secondary | ICD-10-CM | POA: Diagnosis not present

## 2022-02-01 DIAGNOSIS — J4520 Mild intermittent asthma, uncomplicated: Secondary | ICD-10-CM | POA: Diagnosis not present

## 2022-02-01 DIAGNOSIS — M25512 Pain in left shoulder: Secondary | ICD-10-CM | POA: Diagnosis not present

## 2022-02-01 DIAGNOSIS — R319 Hematuria, unspecified: Secondary | ICD-10-CM | POA: Diagnosis not present

## 2022-02-01 DIAGNOSIS — M1990 Unspecified osteoarthritis, unspecified site: Secondary | ICD-10-CM | POA: Diagnosis not present

## 2022-02-01 DIAGNOSIS — Z6827 Body mass index (BMI) 27.0-27.9, adult: Secondary | ICD-10-CM | POA: Diagnosis not present

## 2022-02-01 DIAGNOSIS — Z79899 Other long term (current) drug therapy: Secondary | ICD-10-CM | POA: Diagnosis not present

## 2022-02-01 DIAGNOSIS — R3129 Other microscopic hematuria: Secondary | ICD-10-CM | POA: Diagnosis not present

## 2022-02-16 DIAGNOSIS — M75122 Complete rotator cuff tear or rupture of left shoulder, not specified as traumatic: Secondary | ICD-10-CM | POA: Diagnosis not present

## 2022-03-11 ENCOUNTER — Other Ambulatory Visit (HOSPITAL_COMMUNITY)
Admission: RE | Admit: 2022-03-11 | Discharge: 2022-03-11 | Disposition: A | Discharge status: Home / Self Care | Payer: Federal, State, Local not specified - PPO | Source: Ambulatory Visit | Attending: Obstetrics | Admitting: Obstetrics

## 2022-03-11 ENCOUNTER — Other Ambulatory Visit: Payer: Self-pay | Admitting: Pulmonary Disease

## 2022-03-11 ENCOUNTER — Encounter: Payer: Self-pay | Admitting: Obstetrics

## 2022-03-11 ENCOUNTER — Ambulatory Visit (INDEPENDENT_AMBULATORY_CARE_PROVIDER_SITE_OTHER): Payer: Federal, State, Local not specified - PPO | Admitting: Obstetrics

## 2022-03-11 VITALS — BP 136/87 | HR 67 | Ht 63.0 in | Wt 156.7 lb

## 2022-03-11 DIAGNOSIS — N898 Other specified noninflammatory disorders of vagina: Secondary | ICD-10-CM | POA: Diagnosis not present

## 2022-03-11 DIAGNOSIS — L292 Pruritus vulvae: Secondary | ICD-10-CM

## 2022-03-11 DIAGNOSIS — Z90710 Acquired absence of both cervix and uterus: Secondary | ICD-10-CM

## 2022-03-11 DIAGNOSIS — Z01419 Encounter for gynecological examination (general) (routine) without abnormal findings: Secondary | ICD-10-CM

## 2022-03-11 DIAGNOSIS — R3121 Asymptomatic microscopic hematuria: Secondary | ICD-10-CM | POA: Diagnosis not present

## 2022-03-11 DIAGNOSIS — Z1239 Encounter for other screening for malignant neoplasm of breast: Secondary | ICD-10-CM

## 2022-03-11 DIAGNOSIS — Z1231 Encounter for screening mammogram for malignant neoplasm of breast: Secondary | ICD-10-CM

## 2022-03-11 MED ORDER — FLUCONAZOLE 150 MG PO TABS: 150.0000 mg | ORAL_TABLET | Freq: Once | ORAL | 0 refills | Status: AC

## 2022-03-11 MED ORDER — CLOTRIMAZOLE 1 % EX CREA: 1.0000 | TOPICAL_CREAM | Freq: Two times a day (BID) | CUTANEOUS | 2 refills | Status: AC

## 2022-03-11 NOTE — Progress Notes (Unsigned)
Subjective:        Annette Patterson is a 62 y.o. female here for a routine exam.  Current complaints: Vulva itching and vaginal discharge  Personal health questionnaire:  Is patient [name], have a family history of breast and/or ovarian cancer: yes Is there a family history of uterine cancer diagnosed at age < 41, gastrointestinal cancer, urinary tract cancer, family member who is a Field seismologist syndrome-associated carrier: no Is the patient overweight and hypertensive, family history of diabetes, personal history of gestational diabetes, preeclampsia or PCOS: no Is patient over 15, have PCOS,  family history of premature CHD under age 26, diabetes, smoke, have hypertension or peripheral artery disease:  no At any time, has a partner hit, kicked or otherwise hurt or frightened you?: no Over the past 2 weeks, have you felt down, depressed or hopeless?: no Over the past 2 weeks, have you felt little interest or pleasure in doing things?:no   Gynecologic History No LMP recorded. Patient has had a hysterectomy. Contraception: status post hysterectomy Last Pap: unknown. Results were: normal Last mammogram: 2021. Results were: normal  Obstetric History OB History  Gravida Para Term Preterm AB Living  0 0 0 0 0 0  SAB IAB Ectopic Multiple Live Births  0 0 0 0      Past Medical History:  Diagnosis Date   Hypercholesteremia    Hypertension     Past Surgical History:  Procedure Laterality Date   ABDOMINAL HYSTERECTOMY     ROTATOR CUFF REPAIR       Current Outpatient Medications:    atorvastatin (LIPITOR) 20 MG tablet, , Disp: , Rfl:    cholecalciferol (VITAMIN D3) 25 MCG (1000 UNIT) tablet, Take 1,000 Units by mouth once a week. Pt unsure if dose is correct, Disp: , Rfl:    clotrimazole (LOTRIMIN) 1 % cream, Apply 1 Application topically 2 (two) times daily., Disp: 90 g, Rfl: 2   metoprolol tartrate (LOPRESSOR) 25 MG tablet, Take 25 mg by mouth 2 (two) times daily., Disp: ,  Rfl:    naproxen (NAPROSYN) 500 MG tablet, Take 1 tablet (500 mg total) by mouth 2 (two) times daily. (Patient not taking: Reported on 03/11/2022), Disp: 20 tablet, Rfl: 0 Allergies  Allergen Reactions   Shellfish Allergy Hives    Social History   Tobacco Use   Smoking status: Former   Smokeless tobacco: Former    Quit date: 01/25/1988  Substance Use Topics   Alcohol use: Yes    Alcohol/week: 2.0 standard drinks of alcohol    Types: 2 Cans of beer per week    Family History  Problem Relation Age of Onset   Hypertension Mother    Asthma Mother    Hypertension Father    Breast cancer Maternal Aunt    Breast cancer Cousin    Breast cancer Maternal Aunt    Breast cancer Cousin       Review of Systems  Constitutional: negative for fatigue and weight loss Respiratory: negative for cough and wheezing Cardiovascular: negative for chest pain, fatigue and palpitations Gastrointestinal: negative for abdominal pain and change in bowel habits Musculoskeletal:negative for myalgias Neurological: negative for gait problems and tremors Behavioral/Psych: negative for abusive relationship, depression Endocrine: negative for temperature intolerance    Genitourinary: positive for vulva itching and vaginal discharge.  negative for abnormal menstrual periods, genital lesions, hot flashes, sexual problems Integument/breast: negative for breast lump, breast tenderness, nipple discharge and skin lesion(s)    Objective:  BP 136/87   Pulse 67   Ht 5' 3"$  (1.6 m)   Wt 156 lb 11.2 oz (71.1 kg)   BMI 27.76 kg/m  General:   Alert and no distress  Skin:   no rash or abnormalities  Lungs:   clear to auscultation bilaterally  Heart:   regular rate and rhythm, S1, S2 normal, no murmur, click, rub or gallop  Breasts:   normal without suspicious masses, skin or nipple changes or axillary nodes  Abdomen:  normal findings: no organomegaly, soft, non-tender and no hernia  Pelvis:  External  genitalia: normal general appearance Urinary system: urethral meatus normal and bladder without fullness, nontender Vaginal: normal without tenderness, induration or masses Cervix: absent Adnexa: normal bimanual exam Uterus: absent   Lab Review Urine pregnancy test Labs reviewed yes Radiologic studies reviewed yes  I have spent a total of 20 minutes of face-to-face time, excluding clinical staff time, reviewing notes and preparing to see patient, ordering tests and/or medications, and counseling the patient.   Assessment:    1. Encounter for annual routine gynecological examination  2. History of abdominal hysterectomy  3. Vaginal discharge Rx: - Cervicovaginal ancillary only( [place]) - fluconazole (DIFLUCAN) 150 MG tablet; Take 1 tablet (150 mg total) by mouth once for 1 dose.  Dispense: 1 tablet; Refill: 0  4. Vulvar itching Rx: - clotrimazole (LOTRIMIN) 1 % cream; Apply 1 Application topically 2 (two) times daily.  Dispense: 90 g; Refill: 2  5. Asymptomatic microscopic hematuria Rx: - Urine Culture - Ambulatory referral to Nephrology  6. Screening breast examination Rx: - MM Digital Screening; Future     Plan:    Education reviewed: calcium supplements, depression evaluation, low fat, low cholesterol diet, safe sex/STD prevention, self breast exams, and weight bearing exercise. Mammogram ordered. Follow up in: 1 year.   Meds ordered this encounter  Medications   fluconazole (DIFLUCAN) 150 MG tablet    Sig: Take 1 tablet (150 mg total) by mouth once for 1 dose.    Dispense:  1 tablet    Refill:  0   clotrimazole (LOTRIMIN) 1 % cream    Sig: Apply 1 Application topically 2 (two) times daily.    Dispense:  90 g    Refill:  2   Orders Placed This Encounter  Procedures   Urine Culture   MM Digital Screening    Standing Status:   Future    Standing Expiration Date:   03/13/2023    Order Specific Question:   Reason for Exam (SYMPTOM  OR DIAGNOSIS  REQUIRED)    Answer:   Screening    Order Specific Question:   Preferred imaging location?    Answer:   [facility]   Ambulatory referral to Nephrology    Referral Priority:   Routine    Referral Type:   Consultation    Referral Reason:   Specialty Services Required    Requested Specialty:   Nephrology    Number of Visits Requested:   1    Shelly Bombard, MD 03/12/2022 5:52 PM

## 2022-03-11 NOTE — Progress Notes (Unsigned)
New GYN is in the office for annual Last pap 2016 Hx of abd hysterectomy  Pt reports last mammogram in August 2023 Pt reports vaginal itching Family hx of Breast cancer

## 2022-03-14 LAB — CERVICOVAGINAL ANCILLARY ONLY
Bacterial Vaginitis (gardnerella): POSITIVE — AB
Candida Glabrata: NEGATIVE
Candida Vaginitis: NEGATIVE
Chlamydia: NEGATIVE
Comment: NEGATIVE
Comment: NEGATIVE
Comment: NEGATIVE
Comment: NEGATIVE
Comment: NEGATIVE
Comment: NORMAL
Neisseria Gonorrhea: NEGATIVE
Trichomonas: NEGATIVE

## 2022-03-14 LAB — URINE CULTURE

## 2022-03-15 ENCOUNTER — Encounter: Payer: Self-pay | Admitting: Emergency Medicine

## 2022-03-15 ENCOUNTER — Other Ambulatory Visit: Payer: Self-pay | Admitting: Obstetrics

## 2022-03-15 DIAGNOSIS — B9689 Other specified bacterial agents as the cause of diseases classified elsewhere: Secondary | ICD-10-CM

## 2022-03-15 MED ORDER — METRONIDAZOLE 500 MG PO TABS: 500.0000 mg | ORAL_TABLET | Freq: Two times a day (BID) | ORAL | 2 refills | Status: AC

## 2022-03-17 ENCOUNTER — Telehealth: Payer: Self-pay | Admitting: Emergency Medicine

## 2022-03-17 NOTE — Telephone Encounter (Signed)
Attempted RC to patient. LVM

## 2022-04-26 ENCOUNTER — Ambulatory Visit
Admission: RE | Admit: 2022-04-26 | Discharge: 2022-04-26 | Disposition: A | Discharge status: Home / Self Care | Payer: Federal, State, Local not specified - PPO | Source: Ambulatory Visit | Attending: Pulmonary Disease | Admitting: Pulmonary Disease

## 2022-04-26 DIAGNOSIS — Z1231 Encounter for screening mammogram for malignant neoplasm of breast: Secondary | ICD-10-CM

## 2022-06-21 DIAGNOSIS — M1990 Unspecified osteoarthritis, unspecified site: Secondary | ICD-10-CM | POA: Diagnosis not present

## 2022-06-21 DIAGNOSIS — Z79899 Other long term (current) drug therapy: Secondary | ICD-10-CM | POA: Diagnosis not present

## 2022-06-21 DIAGNOSIS — E7800 Pure hypercholesterolemia, unspecified: Secondary | ICD-10-CM | POA: Diagnosis not present

## 2022-06-21 DIAGNOSIS — J4520 Mild intermittent asthma, uncomplicated: Secondary | ICD-10-CM | POA: Diagnosis not present

## 2022-06-21 DIAGNOSIS — M545 Low back pain, unspecified: Secondary | ICD-10-CM | POA: Diagnosis not present

## 2022-06-21 DIAGNOSIS — E559 Vitamin D deficiency, unspecified: Secondary | ICD-10-CM | POA: Diagnosis not present

## 2022-06-21 DIAGNOSIS — I119 Hypertensive heart disease without heart failure: Secondary | ICD-10-CM | POA: Diagnosis not present

## 2022-07-12 ENCOUNTER — Encounter: Payer: Self-pay | Admitting: Emergency Medicine

## 2022-07-12 ENCOUNTER — Ambulatory Visit
Admission: EM | Admit: 2022-07-12 | Discharge: 2022-07-12 | Disposition: A | Discharge status: Home / Self Care | Payer: Federal, State, Local not specified - PPO | Attending: Physician Assistant | Admitting: Physician Assistant

## 2022-07-12 ENCOUNTER — Other Ambulatory Visit: Payer: Self-pay

## 2022-07-12 DIAGNOSIS — J209 Acute bronchitis, unspecified: Secondary | ICD-10-CM

## 2022-07-12 MED ORDER — PREDNISONE 50 MG PO TABS: ORAL_TABLET | ORAL | 0 refills | Status: AC

## 2022-07-12 NOTE — Discharge Instructions (Addendum)
Return if any problems.

## 2022-07-12 NOTE — ED Provider Notes (Signed)
[facility]    CSN: [id number] Arrival date & time: 07/12/22  1547      History   Chief Complaint Chief Complaint  Patient presents with   Cough    HPI Annette Patterson is a 62 y.o. female.   Patient complains of a cough and congestion.  Patient reports that she has had the cough for the past 10 days.  The history is provided by the patient. No language interpreter was used.  Cough Cough characteristics:  Non-productive Sputum characteristics:  Nondescript Severity:  Mild Onset quality:  Sudden Timing:  Constant Progression:  Worsening Smoker: no   Relieved by:  Nothing Worsened by:  Nothing Ineffective treatments:  None tried   Past Medical History:  Diagnosis Date   Hypercholesteremia    Hypertension     Patient Active Problem List   Diagnosis Date Noted   Palpitations 10/19/2016   Symptomatic menopausal or female climacteric states 01/08/2014    Past Surgical History:  Procedure Laterality Date   ABDOMINAL HYSTERECTOMY     ROTATOR CUFF REPAIR      OB History     Gravida  0   Para  0   Term  0   Preterm  0   AB  0   Living  0      SAB  0   IAB  0   Ectopic  0   Multiple  0   Live Births               Home Medications    Prior to Admission medications   Medication Sig Start Date End Date Taking? Authorizing Provider  predniSONE (DELTASONE) 50 MG tablet One tablet a day for 5 days. 07/12/22  Yes Cheron Schaumann K, PA-C  atorvastatin (LIPITOR) 20 MG tablet  09/25/13   [provider]  cholecalciferol (VITAMIN D3) 25 MCG (1000 UNIT) tablet Take 1,000 Units by mouth once a week. Pt unsure if dose is correct    [provider]  clotrimazole (LOTRIMIN) 1 % cream Apply 1 Application topically 2 (two) times daily. 03/11/22   Brock Bad, MD  metoprolol tartrate (LOPRESSOR) 25 MG tablet Take 25 mg by mouth 2 (two) times daily.    [provider]  metroNIDAZOLE (FLAGYL) 500 MG tablet Take 1 tablet  (500 mg total) by mouth 2 (two) times daily. 03/15/22   Brock Bad, MD  naproxen (NAPROSYN) 500 MG tablet Take 1 tablet (500 mg total) by mouth 2 (two) times daily. Patient not taking: Reported on 03/11/2022 03/29/18   Mardella Layman, MD  estradiol (ESTRACE) 1 MG tablet Take 1 tablet (1 mg total) by mouth daily. 02/21/17 02/13/19  Roe Coombs, CNM    Family History Family History  Problem Relation Age of Onset   Hypertension Mother    Asthma Mother    Hypertension Father    Breast cancer Maternal Aunt    Breast cancer Cousin    Breast cancer Maternal Aunt    Breast cancer Cousin     Social History Social History   Tobacco Use   Smoking status: Former   Smokeless tobacco: Former    Quit date: 01/25/1988  Vaping Use   Vaping Use: Never used  Substance Use Topics   Alcohol use: Yes    Alcohol/week: 2.0 standard drinks of alcohol    Types: 2 Cans of beer per week   Drug use: No     Allergies   Shellfish allergy  Review of Systems Review of Systems  Respiratory:  Positive for cough.   All other systems reviewed and are negative.    Physical Exam Triage Vital Signs ED Triage Vitals  Enc Vitals Group     BP 07/12/22 1717 (!) 144/81     Pulse Rate 07/12/22 1717 74     Resp 07/12/22 1717 18     Temp 07/12/22 1717 97.8 F (36.6 C)     Temp Source 07/12/22 1717 Oral     SpO2 07/12/22 1717 97 %     Weight --      Height --      Head Circumference --      Peak Flow --      Pain Score 07/12/22 1718 0     Pain Loc --      Pain Edu? --      Excl. in GC? --    No data found.  Updated Vital Signs BP (!) 144/81 (BP Location: Left Arm)   Pulse 74   Temp 97.8 F (36.6 C) (Oral)   Resp 18   SpO2 97%   Visual Acuity Right Eye Distance:   Left Eye Distance:   Bilateral Distance:    Right Eye Near:   Left Eye Near:    Bilateral Near:     Physical Exam Vitals and nursing note reviewed.  Constitutional:      Appearance: She is well-developed.  HENT:      Head: Normocephalic.     Mouth/Throat:     Mouth: Mucous membranes are moist.  Eyes:     Pupils: Pupils are equal, round, and reactive to light.  Cardiovascular:     Rate and Rhythm: Normal rate.  Pulmonary:     Effort: Pulmonary effort is normal.  Abdominal:     General: There is no distension.  Musculoskeletal:        General: Normal range of motion.     Cervical back: Normal range of motion.  Neurological:     General: No focal deficit present.     Mental Status: She is alert and oriented to person, place, and time.  Psychiatric:        Mood and Affect: Mood normal.      UC Treatments / Results  Labs (all labs ordered are listed, but only abnormal results are displayed) Labs Reviewed - No data to display  EKG   Radiology No results found.  Procedures Procedures (including critical care time)  Medications Ordered in UC Medications - No data to display  Initial Impression / Assessment and Plan / UC Course  I have reviewed the triage vital signs and the nursing notes.  Pertinent labs & imaging results that were available during my care of the patient were reviewed by me and considered in my medical decision making (see chart for details).      Final Clinical Impressions(s) / UC Diagnoses   Final diagnoses:  Acute bronchitis, unspecified organism     Discharge Instructions      Return if any problems.    ED Prescriptions     Medication Sig Dispense Auth. Provider   predniSONE (DELTASONE) 50 MG tablet One tablet a day for 5 days. 5 tablet Elson Areas, [place]      PDMP not reviewed this encounter. An After Visit Summary was printed and given to the patient.       Elson Areas, [place] 07/12/22 [phone]

## 2022-07-12 NOTE — ED Triage Notes (Signed)
Pt here for cough x 10 days

## 2022-07-21 DIAGNOSIS — R3129 Other microscopic hematuria: Secondary | ICD-10-CM | POA: Diagnosis not present

## 2022-07-21 DIAGNOSIS — M1990 Unspecified osteoarthritis, unspecified site: Secondary | ICD-10-CM | POA: Diagnosis not present

## 2022-07-21 DIAGNOSIS — Z79899 Other long term (current) drug therapy: Secondary | ICD-10-CM | POA: Diagnosis not present

## 2022-07-21 DIAGNOSIS — M545 Low back pain, unspecified: Secondary | ICD-10-CM | POA: Diagnosis not present

## 2022-07-21 DIAGNOSIS — J4520 Mild intermittent asthma, uncomplicated: Secondary | ICD-10-CM | POA: Diagnosis not present

## 2022-08-25 DIAGNOSIS — E559 Vitamin D deficiency, unspecified: Secondary | ICD-10-CM | POA: Diagnosis not present

## 2022-08-25 DIAGNOSIS — J4520 Mild intermittent asthma, uncomplicated: Secondary | ICD-10-CM | POA: Diagnosis not present

## 2022-08-25 DIAGNOSIS — E7800 Pure hypercholesterolemia, unspecified: Secondary | ICD-10-CM | POA: Diagnosis not present

## 2022-08-25 DIAGNOSIS — M1990 Unspecified osteoarthritis, unspecified site: Secondary | ICD-10-CM | POA: Diagnosis not present

## 2022-08-25 DIAGNOSIS — Z79899 Other long term (current) drug therapy: Secondary | ICD-10-CM | POA: Diagnosis not present

## 2022-08-25 DIAGNOSIS — M545 Low back pain, unspecified: Secondary | ICD-10-CM | POA: Diagnosis not present

## 2022-08-25 DIAGNOSIS — I119 Hypertensive heart disease without heart failure: Secondary | ICD-10-CM | POA: Diagnosis not present

## 2023-01-26 DIAGNOSIS — M1990 Unspecified osteoarthritis, unspecified site: Secondary | ICD-10-CM | POA: Diagnosis not present

## 2023-01-26 DIAGNOSIS — M545 Low back pain, unspecified: Secondary | ICD-10-CM | POA: Diagnosis not present

## 2023-01-26 DIAGNOSIS — J4520 Mild intermittent asthma, uncomplicated: Secondary | ICD-10-CM | POA: Diagnosis not present

## 2023-01-26 DIAGNOSIS — Z79899 Other long term (current) drug therapy: Secondary | ICD-10-CM | POA: Diagnosis not present

## 2023-01-26 DIAGNOSIS — R3129 Other microscopic hematuria: Secondary | ICD-10-CM | POA: Diagnosis not present

## 2023-03-13 ENCOUNTER — Other Ambulatory Visit: Payer: Self-pay | Admitting: Pulmonary Disease

## 2023-03-13 DIAGNOSIS — Z1231 Encounter for screening mammogram for malignant neoplasm of breast: Secondary | ICD-10-CM

## 2023-04-27 ENCOUNTER — Ambulatory Visit
Admission: RE | Admit: 2023-04-27 | Discharge: 2023-04-27 | Disposition: A | Discharge status: Home / Self Care | Payer: Federal, State, Local not specified - PPO | Source: Ambulatory Visit | Attending: Pulmonary Disease | Admitting: Pulmonary Disease

## 2023-04-27 DIAGNOSIS — Z1231 Encounter for screening mammogram for malignant neoplasm of breast: Secondary | ICD-10-CM | POA: Diagnosis not present

## 2023-05-23 DIAGNOSIS — M545 Low back pain, unspecified: Secondary | ICD-10-CM | POA: Diagnosis not present

## 2023-05-23 DIAGNOSIS — J4520 Mild intermittent asthma, uncomplicated: Secondary | ICD-10-CM | POA: Diagnosis not present

## 2023-05-23 DIAGNOSIS — R3129 Other microscopic hematuria: Secondary | ICD-10-CM | POA: Diagnosis not present

## 2023-05-23 DIAGNOSIS — M1990 Unspecified osteoarthritis, unspecified site: Secondary | ICD-10-CM | POA: Diagnosis not present

## 2023-05-23 DIAGNOSIS — J309 Allergic rhinitis, unspecified: Secondary | ICD-10-CM | POA: Diagnosis not present

## 2023-05-23 DIAGNOSIS — E559 Vitamin D deficiency, unspecified: Secondary | ICD-10-CM | POA: Diagnosis not present

## 2023-05-23 DIAGNOSIS — N281 Cyst of kidney, acquired: Secondary | ICD-10-CM | POA: Diagnosis not present

## 2023-05-23 DIAGNOSIS — Z79899 Other long term (current) drug therapy: Secondary | ICD-10-CM | POA: Diagnosis not present

## 2023-10-19 DIAGNOSIS — M1990 Unspecified osteoarthritis, unspecified site: Secondary | ICD-10-CM | POA: Diagnosis not present

## 2023-10-19 DIAGNOSIS — J4520 Mild intermittent asthma, uncomplicated: Secondary | ICD-10-CM | POA: Diagnosis not present

## 2023-10-19 DIAGNOSIS — Z79899 Other long term (current) drug therapy: Secondary | ICD-10-CM | POA: Diagnosis not present

## 2023-10-19 DIAGNOSIS — E559 Vitamin D deficiency, unspecified: Secondary | ICD-10-CM | POA: Diagnosis not present

## 2023-11-02 DIAGNOSIS — R202 Paresthesia of skin: Secondary | ICD-10-CM | POA: Diagnosis not present

## 2023-11-02 DIAGNOSIS — R200 Anesthesia of skin: Secondary | ICD-10-CM | POA: Diagnosis not present
